# Patient Record
Sex: Female | Born: 1955 | Race: White | Hispanic: No | Smoking: Never smoker
Health system: Southern US, Community
[De-identification: ages and names within clinical notes are randomized; demographics above are authoritative.]

## PROBLEM LIST (undated history)

## (undated) DIAGNOSIS — E079 Disorder of thyroid, unspecified: Secondary | ICD-10-CM

## (undated) DIAGNOSIS — E785 Hyperlipidemia, unspecified: Secondary | ICD-10-CM

## (undated) DIAGNOSIS — I10 Essential (primary) hypertension: Secondary | ICD-10-CM

## (undated) HISTORY — DX: Disorder of thyroid, unspecified: E07.9

## (undated) HISTORY — DX: Hyperlipidemia, unspecified: E78.5

## (undated) HISTORY — PX: COLONOSCOPY: SHX174

## (undated) HISTORY — DX: Essential (primary) hypertension: I10

## (undated) HISTORY — PX: FACIAL RECONSTRUCTION SURGERY: SHX631

---

## 1998-09-25 ENCOUNTER — Ambulatory Visit (HOSPITAL_COMMUNITY): Admission: RE | Admit: 1998-09-25 | Discharge: 1998-09-25 | Payer: Self-pay | Admitting: Family Medicine

## 1998-09-25 ENCOUNTER — Encounter: Payer: Self-pay | Admitting: Family Medicine

## 1998-10-09 ENCOUNTER — Ambulatory Visit (HOSPITAL_BASED_OUTPATIENT_CLINIC_OR_DEPARTMENT_OTHER): Admission: RE | Admit: 1998-10-09 | Discharge: 1998-10-09 | Payer: Self-pay | Admitting: General Surgery

## 1998-10-09 ENCOUNTER — Encounter: Payer: Self-pay | Admitting: General Surgery

## 2000-07-10 ENCOUNTER — Ambulatory Visit (HOSPITAL_COMMUNITY): Admission: RE | Admit: 2000-07-10 | Discharge: 2000-07-10 | Payer: Self-pay | Admitting: Family Medicine

## 2000-07-10 ENCOUNTER — Encounter: Payer: Self-pay | Admitting: Family Medicine

## 2007-09-06 HISTORY — PX: BREAST SURGERY: SHX581

## 2012-12-11 ENCOUNTER — Other Ambulatory Visit (INDEPENDENT_AMBULATORY_CARE_PROVIDER_SITE_OTHER): Payer: BC Managed Care – PPO

## 2012-12-11 DIAGNOSIS — E039 Hypothyroidism, unspecified: Secondary | ICD-10-CM

## 2012-12-11 LAB — THYROID PANEL WITH TSH
Free Thyroxine Index: 3 (ref 1.0–3.9)
T3 Uptake: 33.3 % (ref 22.5–37.0)
T4, Total: 9.1 ug/dL (ref 5.0–12.5)
TSH: 6.208 u[IU]/mL — ABNORMAL HIGH (ref 0.350–4.500)

## 2012-12-11 NOTE — Progress Notes (Signed)
Patient came in for labs only.

## 2012-12-13 ENCOUNTER — Other Ambulatory Visit: Payer: Self-pay | Admitting: Nurse Practitioner

## 2012-12-13 MED ORDER — LEVOTHYROXINE SODIUM 100 MCG IV SOLR: 100.0000 ug | Freq: Every day | INTRAVENOUS | Status: DC

## 2013-01-17 ENCOUNTER — Encounter: Payer: Self-pay | Admitting: Gastroenterology

## 2013-02-06 ENCOUNTER — Telehealth: Payer: Self-pay | Admitting: Family Medicine

## 2013-02-06 NOTE — Telephone Encounter (Signed)
Appt made for tomorow

## 2013-02-07 ENCOUNTER — Ambulatory Visit (INDEPENDENT_AMBULATORY_CARE_PROVIDER_SITE_OTHER): Payer: BC Managed Care – PPO | Admitting: Family Medicine

## 2013-02-07 ENCOUNTER — Encounter: Payer: Self-pay | Admitting: Family Medicine

## 2013-02-07 VITALS — BP 149/94 | HR 64 | Temp 98.8°F | Wt 198.4 lb

## 2013-02-07 DIAGNOSIS — J029 Acute pharyngitis, unspecified: Secondary | ICD-10-CM

## 2013-02-07 DIAGNOSIS — R6889 Other general symptoms and signs: Secondary | ICD-10-CM

## 2013-02-07 LAB — POCT RAPID STREP A (OFFICE): Rapid Strep A Screen: NEGATIVE

## 2013-02-07 LAB — POCT INFLUENZA A/B
Influenza A, POC: NEGATIVE
Influenza B, POC: NEGATIVE

## 2013-02-07 NOTE — Progress Notes (Signed)
Patient ID: Madeline Clark, female   DOB: 1956-08-03, 57 y.o.   MRN: [id number] SUBJECTIVE: Chief Complaint  Patient presents with  . Acute Visit    flu -like symptoms  and sore throat   HPI: sorethroat and fevers 3 days. Better today. Came to get checked. No SOB. No exposure to strep.  PMH/PSH: reviewed/updated in Epic  SH/FH: reviewed/updated in Epic  Allergies: reviewed/updated in Epic  Medications: reviewed/updated in Epic  Immunizations: reviewed/updated in Epic  ROS: As above in the HPI. All other systems are stable or negative.  OBJECTIVE: APPEARANCE:  Patient in no acute distress.The patient appeared well nourished and normally developed. Acyanotic. Waist: VITAL SIGNS:BP 149/94  Pulse 64  Temp(Src) 98.8 F (37.1 C) (Oral)  Wt 198 lb 6.4 oz (89.994 kg) WF  SKIN: warm and  Dry without overt rashes, tattoos and scars  HEAD and Neck: without JVD, Head and scalp: normal Eyes:No scleral icterus. Fundi normal, eye movements normal. Ears: Auricle normal, canal normal, Tympanic membranes normal, insufflation normal. Nose:congested. Throat:red , no exudates Neck & thyroid: normal  CHEST & LUNGS: Chest wall: normal Lungs: Clear  CVS: Reveals the PMI to be normally located. Regular rhythm, First and Second Heart sounds are normal,  absence of murmurs, rubs or gallops. Peripheral vasculature: Radial pulses: normal Dorsal pedis pulses: normal Posterior pulses: normal  ABDOMEN:  Appearance: normal Benign, no organomegaly, no masses, no Abdominal Aortic enlargement. No Guarding , no rebound. No Bruits. Bowel sounds: normal  RECTAL: N/A GU: N/A  EXTREMETIES: nonedematous. Both Femoral and Pedal pulses are normal.   ASSESSMENT: Flu-like symptoms - Plan: POCT Influenza A/B  Sore throat - Plan: POCT rapid strep A  PLAN: cepacol samples given. Lozenges. Saline gargles Orders Placed This Encounter  Procedures  . POCT Influenza A/B  . POCT rapid  strep A   No orders of the defined types were placed in this encounter.   Results for orders placed in visit on 02/07/13  POCT INFLUENZA A/B      Result Value Range   Influenza A, POC Negative     Influenza B, POC Negative    POCT RAPID STREP A (OFFICE)      Result Value Range   Rapid Strep A Screen Negative  Negative   Note for work.  Return if symptoms worsen or fail to improve.  Sebastain Fishbaugh P. [place]esto Charon, M.D.

## 2013-03-20 ENCOUNTER — Other Ambulatory Visit: Payer: Self-pay

## 2013-03-20 MED ORDER — LEVOTHYROXINE SODIUM 100 MCG IV SOLR: 100.0000 ug | Freq: Every day | INTRAVENOUS | Status: DC

## 2013-04-10 ENCOUNTER — Other Ambulatory Visit (INDEPENDENT_AMBULATORY_CARE_PROVIDER_SITE_OTHER): Payer: BC Managed Care – PPO

## 2013-04-10 DIAGNOSIS — E039 Hypothyroidism, unspecified: Secondary | ICD-10-CM

## 2013-04-10 NOTE — Progress Notes (Signed)
Patient came in for re-check on thyroid

## 2013-04-11 LAB — THYROID PANEL WITH TSH
Free Thyroxine Index: 2.2 (ref 1.2–4.9)
T3 Uptake Ratio: 29 % (ref 24–39)
T4, Total: 7.6 ug/dL (ref 4.5–12.0)
TSH: 1.07 u[IU]/mL (ref 0.450–4.500)

## 2013-04-18 ENCOUNTER — Ambulatory Visit (INDEPENDENT_AMBULATORY_CARE_PROVIDER_SITE_OTHER): Payer: BC Managed Care – PPO | Admitting: Nurse Practitioner

## 2013-04-18 ENCOUNTER — Encounter: Payer: Self-pay | Admitting: Nurse Practitioner

## 2013-04-18 VITALS — BP 163/91 | HR 66 | Temp 98.2°F | Ht 62.75 in | Wt 202.0 lb

## 2013-04-18 DIAGNOSIS — Z01419 Encounter for gynecological examination (general) (routine) without abnormal findings: Secondary | ICD-10-CM

## 2013-04-18 DIAGNOSIS — I10 Essential (primary) hypertension: Secondary | ICD-10-CM

## 2013-04-18 DIAGNOSIS — Z124 Encounter for screening for malignant neoplasm of cervix: Secondary | ICD-10-CM

## 2013-04-18 DIAGNOSIS — E039 Hypothyroidism, unspecified: Secondary | ICD-10-CM | POA: Insufficient documentation

## 2013-04-18 DIAGNOSIS — Z0000 Encounter for general adult medical examination without abnormal findings: Secondary | ICD-10-CM

## 2013-04-18 LAB — POCT URINALYSIS DIPSTICK
Ketones, UA: NEGATIVE
Protein, UA: NEGATIVE
Spec Grav, UA: 1.025
pH, UA: 6

## 2013-04-18 LAB — POCT UA - MICROSCOPIC ONLY: Crystals, Ur, HPF, POC: NEGATIVE

## 2013-04-18 LAB — POCT CBC
Granulocyte percent: 63.1 %G (ref 37–80)
Lymph, poc: 2.2 (ref 0.6–3.4)
MCH, POC: 28.1 pg (ref 27–31.2)
MPV: 8.9 fL (ref 0–99.8)
POC Granulocyte: 4.3 (ref 2–6.9)
POC LYMPH PERCENT: 31.8 %L (ref 10–50)
Platelet Count, POC: 249 10*3/uL (ref 142–424)
RBC: 5.1 M/uL (ref 4.04–5.48)

## 2013-04-18 MED ORDER — LEVOTHYROXINE SODIUM 100 MCG PO TABS: 100.0000 ug | ORAL_TABLET | Freq: Every day | ORAL | Status: DC

## 2013-04-18 MED ORDER — HYDROCHLOROTHIAZIDE 25 MG PO TABS: 25.0000 mg | ORAL_TABLET | Freq: Every day | ORAL | Status: DC

## 2013-04-18 NOTE — Patient Instructions (Signed)

## 2013-04-18 NOTE — Progress Notes (Signed)
Subjective:    Patient ID: Madeline Clark, female    DOB: 04-18-1956, 57 y.o.   MRN: [id number]  HPI  Patient in today for a CPE and PAP- she is doing well. Only medical problem is hypothyroidism in which she takes levothyroxin for- SHe has no complaints today.  History of hypertension use to be on HCTZ- ran out and never called back for refill.  Also suppose to be on crestor for cholesterol,patient said it made her feel weird so she stopped taking.   Review of Systems  Constitutional: Negative.   HENT: Negative.   Eyes: Negative.   Respiratory: Negative.   Cardiovascular: Negative.   Gastrointestinal: Negative.   Endocrine: Negative.   Genitourinary: Negative.   Musculoskeletal: Negative.   Allergic/Immunologic: Negative.   Neurological: Negative.   Hematological: Negative.   Psychiatric/Behavioral: Negative.        Objective:   Physical Exam  Constitutional: She is oriented to person, place, and time. She appears well-developed and well-nourished.  HENT:  Head: Normocephalic.  Right Ear: Hearing, tympanic membrane, external ear and ear canal normal.  Left Ear: Hearing, tympanic membrane, external ear and ear canal normal.  Nose: Nose normal.  Mouth/Throat: Uvula is midline and oropharynx is clear and moist.  Eyes: Conjunctivae and EOM are normal. Pupils are equal, round, and reactive to light.  Neck: Normal range of motion and full passive range of motion without pain. Neck supple. No JVD present. Carotid bruit is not present. No mass and no thyromegaly present.  Cardiovascular: Normal rate, normal heart sounds and intact distal pulses.   No murmur heard. Pulmonary/Chest: Effort normal and breath sounds normal. Right breast exhibits no inverted nipple, no mass, no nipple discharge, no skin change and no tenderness. Left breast exhibits no inverted nipple, no mass, no nipple discharge, no skin change and no tenderness.  Abdominal: Soft. Bowel sounds are normal. She exhibits  no mass. There is no tenderness.  Genitourinary: Vagina normal and uterus normal. No breast swelling, tenderness, discharge or bleeding.  bimanual exam-No adnexal masses or tenderness.  Cervix parous and pink- no discharge  Musculoskeletal: Normal range of motion.  Lymphadenopathy:    She has no cervical adenopathy.  Neurological: She is alert and oriented to person, place, and time.  Skin: Skin is warm and dry.  Psychiatric: She has a normal mood and affect. Her behavior is normal. Judgment and thought content normal.   BP 163/91  Pulse 66  Temp(Src) 98.2 F (36.8 C) (Oral)  Ht 5' 2.75" (1.594 m)  Wt 202 lb (91.627 kg)  BMI 36.06 kg/m2        Assessment & Plan:   1. Annual physical exam   2. Hypothyroidism   3. Encounter for routine gynecological examination   4. Hypertension    Orders Placed This Encounter  Procedures  . Urine culture  . CMP14+EGFR  . NMR, lipoprofile  . POCT UA - Microscopic Only  . POCT urinalysis dipstick  . POCT CBC   Meds ordered this encounter  Medications  . DISCONTD: levothyroxine (SYNTHROID, LEVOTHROID) 100 MCG tablet    Sig: Take 100 mcg by mouth daily before breakfast.  . levothyroxine (SYNTHROID, LEVOTHROID) 100 MCG tablet    Sig: Take 1 tablet (100 mcg total) by mouth daily before breakfast.    Dispense:  90 tablet    Refill:  1   Low fat diet and exercise Low NA+ in diet Labs pending- May need to go on statin if cholesterol looks bad  *  Patient needs diagnostic mammogram on right as repeat but refused back in February due to expense- Still refusing- Going to call insurance and find out why it cost her so much and she will let me know what she wants to do.  Mary-Margaret Daphine Deutscher, FN

## 2013-04-20 LAB — NMR, LIPOPROFILE
HDL Cholesterol by NMR: 55 mg/dL (ref >=40)
HDL Particle Number: 30.9 umol/L (ref >=30.5)
LDLC SERPL CALC-MCNC: 109 mg/dL — ABNORMAL HIGH (ref <100)

## 2013-04-20 LAB — CMP14+EGFR
AST: 17 IU/L (ref 0–40)
BUN: 10 mg/dL (ref 6–24)
CO2: 26 mmol/L (ref 18–29)
Calcium: 9.7 mg/dL (ref 8.7–10.2)
Creatinine, Ser: 0.93 mg/dL (ref 0.57–1.00)
Globulin, Total: 2.2 g/dL (ref 1.5–4.5)
Glucose: 99 mg/dL (ref 65–99)
Sodium: 144 mmol/L (ref 134–144)

## 2013-04-23 LAB — PAP IG W/ RFLX HPV ASCU: PAP Smear Comment: 0

## 2013-04-29 ENCOUNTER — Encounter: Payer: Self-pay | Admitting: *Deleted

## 2013-05-17 ENCOUNTER — Encounter: Payer: BC Managed Care – PPO | Admitting: Nurse Practitioner

## 2013-08-20 ENCOUNTER — Telehealth: Payer: Self-pay | Admitting: Nurse Practitioner

## 2013-08-20 MED ORDER — LEVOTHYROXINE SODIUM 100 MCG PO TABS: 100.0000 ug | ORAL_TABLET | Freq: Every day | ORAL | Status: DC

## 2013-08-20 NOTE — Telephone Encounter (Signed)
rx sent to pharmacy

## 2013-08-21 NOTE — Telephone Encounter (Signed)
Message left that rx was sent to pharmacy

## 2013-11-20 ENCOUNTER — Other Ambulatory Visit: Payer: Self-pay | Admitting: Nurse Practitioner

## 2013-11-20 ENCOUNTER — Ambulatory Visit (INDEPENDENT_AMBULATORY_CARE_PROVIDER_SITE_OTHER): Payer: BC Managed Care – PPO | Admitting: Nurse Practitioner

## 2013-11-20 ENCOUNTER — Encounter: Payer: Self-pay | Admitting: Nurse Practitioner

## 2013-11-20 VITALS — BP 144/81 | HR 52 | Temp 97.8°F | Ht 62.75 in | Wt 205.6 lb

## 2013-11-20 DIAGNOSIS — E039 Hypothyroidism, unspecified: Secondary | ICD-10-CM

## 2013-11-20 DIAGNOSIS — E785 Hyperlipidemia, unspecified: Secondary | ICD-10-CM

## 2013-11-20 DIAGNOSIS — I10 Essential (primary) hypertension: Secondary | ICD-10-CM | POA: Insufficient documentation

## 2013-11-20 MED ORDER — LEVOTHYROXINE SODIUM 100 MCG PO TABS: 100.0000 ug | ORAL_TABLET | Freq: Every day | ORAL | Status: DC

## 2013-11-20 MED ORDER — SIMVASTATIN 40 MG PO TABS: 40.0000 mg | ORAL_TABLET | Freq: Every day | ORAL | Status: DC

## 2013-11-20 NOTE — Progress Notes (Signed)
   Subjective:    Patient ID: Madeline Clark, female    DOB: [birth year], 58 y.o.   MRN: [id number]  Patient presents today for followup of chronic illness. Patient is complaining of dry, red eyes for several months. Patient has to use eyedrops daily for lubrication. Has been told in the past that she does not produce enough tears.   Hypertension This is a chronic problem. The current episode started more than 1 year ago. The problem has been gradually improving since onset. Associated symptoms include blurred vision. Pertinent negatives include no chest pain, headaches, palpitations or peripheral edema. Risk factors for coronary artery disease include sedentary lifestyle. Past treatments include diuretics. Compliance problems include exercise and diet.  Hypertensive end-organ damage includes a thyroid problem.   Hypothyroidism Synthyroid 100 mcg daily - denies fatigue, heat/cold intolerance, or weight gain/loss    Review of Systems  Constitutional: Negative for fatigue and unexpected weight change.  Eyes: Positive for blurred vision.  Cardiovascular: Negative for chest pain and palpitations.  Gastrointestinal: Negative for diarrhea and constipation.  Neurological: Negative for dizziness and headaches.  Psychiatric/Behavioral: Negative for sleep disturbance.       Objective:   Physical Exam  Constitutional: She is oriented to person, place, and time. She appears well-developed and well-nourished.  HENT:  Head: Normocephalic.  Right Ear: External ear normal.  Left Ear: External ear normal.  Mouth/Throat: Oropharynx is clear and moist.  Eyes: Pupils are equal, round, and reactive to light.  Neck: Normal range of motion. Neck supple. No thyromegaly present.  Cardiovascular: Normal rate, regular rhythm and normal heart sounds.   Pulmonary/Chest: Effort normal and breath sounds normal.  Abdominal: Soft. Bowel sounds are normal.  Neurological: She is alert and oriented to person,  place, and time.  Skin: Skin is warm and dry.  Psychiatric: She has a normal mood and affect. Her behavior is normal. Judgment and thought content normal.    BP 144/81  Pulse 52  Temp(Src) 97.8 F (36.6 C) (Oral)  Ht 5' 2.75" (1.594 m)  Wt 205 lb 9.6 oz (93.26 kg)  BMI 36.70 kg/m2       Assessment & Plan:   1. Essential hypertension, benign   2. Hypothyroidism    Meds ordered this encounter  Medications  . levothyroxine (SYNTHROID, LEVOTHROID) 100 MCG tablet    Sig: Take 1 tablet (100 mcg total) by mouth daily before breakfast.    Dispense:  90 tablet    Refill:  1    Order Specific Question:  Supervising Provider    Answer:  Chipper Herb [[id number]]  . simvastatin (ZOCOR) 40 MG tablet    Sig: Take 1 tablet (40 mg total) by mouth at bedtime.    Dispense:  90 tablet    Refill:  1    Order Specific Question:  Supervising Provider    Answer:  Joycelyn Man   Patietn will check on cost of mammogram Want sto wait and schedule colonoscopy in summer Labs done at work - revuewd with patient- LDL elevated,  Patient to make opthalmology appt Health maintenance reviewed Diet and exercise encouraged Continue all meds Follow up  In 6 months   Abrams, FNP

## 2013-11-20 NOTE — Patient Instructions (Signed)
Health Maintenance, Female A healthy lifestyle and preventative care can promote health and wellness.  Maintain regular health, dental, and eye exams.  Eat a healthy diet. Foods like vegetables, fruits, whole grains, low-fat dairy products, and lean protein foods contain the nutrients you need without too many calories. Decrease your intake of foods high in solid fats, added sugars, and salt. Get information about a proper diet from your caregiver, if necessary.  Regular physical exercise is one of the most important things you can do for your health. Most adults should get at least 150 minutes of moderate-intensity exercise (any activity that increases your heart rate and causes you to sweat) each week. In addition, most adults need muscle-strengthening exercises on 2 or more days a week.   Maintain a healthy weight. The body mass index (BMI) is a screening tool to identify possible weight problems. It provides an estimate of body fat based on height and weight. Your caregiver can help determine your BMI, and can help you achieve or maintain a healthy weight. For adults 20 years and older:  A BMI below 18.5 is considered underweight.  A BMI of 18.5 to 24.9 is normal.  A BMI of 25 to 29.9 is considered overweight.  A BMI of 30 and above is considered obese.  Maintain normal blood lipids and cholesterol by exercising and minimizing your intake of saturated fat. Eat a balanced diet with plenty of fruits and vegetables. Blood tests for lipids and cholesterol should begin at age 68 and be repeated every 5 years. If your lipid or cholesterol levels are high, you are over 50, or you are a high risk for heart disease, you may need your cholesterol levels checked more frequently.Ongoing high lipid and cholesterol levels should be treated with medicines if diet and exercise are not effective.  If you smoke, find out from your caregiver how to quit. If you do not use tobacco, do not start.  Lung  cancer screening is recommended for adults aged 58 80 years who are at high risk for developing lung cancer because of a history of smoking. Yearly low-dose computed tomography (CT) is recommended for people who have at least a 30-pack-year history of smoking and are a current smoker or have quit within the past 15 years. A pack year of smoking is smoking an average of 1 pack of cigarettes a day for 1 year (for example: 1 pack a day for 30 years or 2 packs a day for 15 years). Yearly screening should continue until the smoker has stopped smoking for at least 15 years. Yearly screening should also be stopped for people who develop a health problem that would prevent them from having lung cancer treatment.  If you are pregnant, do not drink alcohol. If you are breastfeeding, be very cautious about drinking alcohol. If you are not pregnant and choose to drink alcohol, do not exceed 1 drink per day. One drink is considered to be 12 ounces (355 mL) of beer, 5 ounces (148 mL) of wine, or 1.5 ounces (44 mL) of liquor.  Avoid use of street drugs. Do not share needles with anyone. Ask for help if you need support or instructions about stopping the use of drugs.  High blood pressure causes heart disease and increases the risk of stroke. Blood pressure should be checked at least every 1 to 2 years. Ongoing high blood pressure should be treated with medicines, if weight loss and exercise are not effective.  If you are 55 to  56 years old, ask your caregiver if you should take aspirin to prevent strokes.  Diabetes screening involves taking a blood sample to check your fasting blood sugar level. This should be done once every 3 years, after age 40, if you are within normal weight and without risk factors for diabetes. Testing should be considered at a younger age or be carried out more frequently if you are overweight and have at least 1 risk factor for diabetes.  Breast cancer screening is essential preventative care  for women. You should practice "breast self-awareness." This means understanding the normal appearance and feel of your breasts and may include breast self-examination. Any changes detected, no matter how small, should be reported to a caregiver. Women in their 18s and 30s should have a clinical breast exam (CBE) by a caregiver as part of a regular health exam every 1 to 3 years. After age 39, women should have a CBE every year. Starting at age 53, women should consider having a mammogram (breast X-ray) every year. Women who have a family history of breast cancer should talk to their caregiver about genetic screening. Women at a high risk of breast cancer should talk to their caregiver about having an MRI and a mammogram every year.  Breast cancer gene (BRCA)-related cancer risk assessment is recommended for women who have family members with BRCA-related cancers. BRCA-related cancers include breast, ovarian, tubal, and peritoneal cancers. Having family members with these cancers may be associated with an increased risk for harmful changes (mutations) in the breast cancer genes BRCA1 and BRCA2. Results of the assessment will determine the need for genetic counseling and BRCA1 and BRCA2 testing.  The Pap test is a screening test for cervical cancer. Women should have a Pap test starting at age 69. Between ages 41 and 71, Pap tests should be repeated every 2 years. Beginning at age 66, you should have a Pap test every 3 years as long as the past 3 Pap tests have been normal. If you had a hysterectomy for a problem that was not cancer or a condition that could lead to cancer, then you no longer need Pap tests. If you are between ages 25 and 82, and you have had normal Pap tests going back 10 years, you no longer need Pap tests. If you have had past treatment for cervical cancer or a condition that could lead to cancer, you need Pap tests and screening for cancer for at least 20 years after your treatment. If Pap  tests have been discontinued, risk factors (such as a new sexual partner) need to be reassessed to determine if screening should be resumed. Some women have medical problems that increase the chance of getting cervical cancer. In these cases, your caregiver may recommend more frequent screening and Pap tests.  The human papillomavirus (HPV) test is an additional test that may be used for cervical cancer screening. The HPV test looks for the virus that can cause the cell changes on the cervix. The cells collected during the Pap test can be tested for HPV. The HPV test could be used to screen women aged 1 years and older, and should be used in women of any age who have unclear Pap test results. After the age of 36, women should have HPV testing at the same frequency as a Pap test.  Colorectal cancer can be detected and often prevented. Most routine colorectal cancer screening begins at the age of 58 and continues through age 71. However, your caregiver  may recommend screening at an earlier age if you have risk factors for colon cancer. On a yearly basis, your caregiver may provide home test kits to check for hidden blood in the stool. Use of a small camera at the end of a tube, to directly examine the colon (sigmoidoscopy or colonoscopy), can detect the earliest forms of colorectal cancer. Talk to your caregiver about this at age 73, when routine screening begins. Direct examination of the colon should be repeated every 5 to 10 years through age 61, unless early forms of pre-cancerous polyps or small growths are found.  Hepatitis C blood testing is recommended for all people born from [birth year] through 1963 and any individual with known risks for hepatitis C.  Practice safe sex. Use condoms and avoid high-risk sexual practices to reduce the spread of sexually transmitted infections (STIs). Sexually active women aged 38 and younger should be checked for Chlamydia, which is a common sexually transmitted infection.  Older women with new or multiple partners should also be tested for Chlamydia. Testing for other STIs is recommended if you are sexually active and at increased risk.  Osteoporosis is a disease in which the bones lose minerals and strength with aging. This can result in serious bone fractures. The risk of osteoporosis can be identified using a bone density scan. Women ages 44 and over and women at risk for fractures or osteoporosis should discuss screening with their caregivers. Ask your caregiver whether you should be taking a calcium supplement or vitamin D to reduce the rate of osteoporosis.  Menopause can be associated with physical symptoms and risks. Hormone replacement therapy is available to decrease symptoms and risks. You should talk to your caregiver about whether hormone replacement therapy is right for you.  Use sunscreen. Apply sunscreen liberally and repeatedly throughout the day. You should seek shade when your shadow is shorter than you. Protect yourself by wearing long sleeves, pants, a wide-brimmed hat, and sunglasses year round, whenever you are outdoors.  Notify your caregiver of new moles or changes in moles, especially if there is a change in shape or color. Also notify your caregiver if a mole is larger than the size of a pencil eraser.  Stay current with your immunizations. Document Released: 03/07/2011 Document Revised: 12/17/2012 Document Reviewed: 03/07/2011 [facility] Patient Information 2014 [place].

## 2014-03-04 ENCOUNTER — Ambulatory Visit (INDEPENDENT_AMBULATORY_CARE_PROVIDER_SITE_OTHER): Payer: BC Managed Care – PPO | Admitting: Nurse Practitioner

## 2014-03-04 ENCOUNTER — Encounter: Payer: Self-pay | Admitting: Nurse Practitioner

## 2014-03-04 VITALS — BP 126/64 | HR 62 | Temp 98.1°F | Ht 62.0 in | Wt 205.0 lb

## 2014-03-04 DIAGNOSIS — Z1211 Encounter for screening for malignant neoplasm of colon: Secondary | ICD-10-CM

## 2014-03-04 DIAGNOSIS — E785 Hyperlipidemia, unspecified: Secondary | ICD-10-CM

## 2014-03-04 DIAGNOSIS — E038 Other specified hypothyroidism: Secondary | ICD-10-CM

## 2014-03-04 DIAGNOSIS — I10 Essential (primary) hypertension: Secondary | ICD-10-CM

## 2014-03-04 MED ORDER — SIMVASTATIN 40 MG PO TABS: 40.0000 mg | ORAL_TABLET | Freq: Every day | ORAL | Status: DC

## 2014-03-04 MED ORDER — HYDROCHLOROTHIAZIDE 25 MG PO TABS: ORAL_TABLET | ORAL | Status: DC

## 2014-03-04 MED ORDER — LEVOTHYROXINE SODIUM 100 MCG PO TABS: 100.0000 ug | ORAL_TABLET | Freq: Every day | ORAL | Status: DC

## 2014-03-04 NOTE — Patient Instructions (Signed)
Hypertension Hypertension, commonly called high blood pressure, is when the force of blood pumping through your arteries is too strong. Your arteries are the blood vessels that carry blood from your heart throughout your body. A blood pressure reading consists of a higher number over a lower number, such as 110/72. The higher number (systolic) is the pressure inside your arteries when your heart pumps. The lower number (diastolic) is the pressure inside your arteries when your heart relaxes. Ideally you want your blood pressure below 120/80. Hypertension forces your heart to work harder to pump blood. Your arteries may become narrow or stiff. Having hypertension puts you at risk for heart disease, stroke, and other problems.  RISK FACTORS Some risk factors for high blood pressure are controllable. Others are not.  Risk factors you cannot control include:   Race. You may be at higher risk if you are African American.  Age. Risk increases with age.  Gender. Men are at higher risk than women before age 45 years. After age 65, women are at higher risk than men. Risk factors you can control include:  Not getting enough exercise or physical activity.  Being overweight.  Getting too much fat, sugar, calories, or salt in your diet.  Drinking too much alcohol. SIGNS AND SYMPTOMS Hypertension does not usually cause signs or symptoms. Extremely high blood pressure (hypertensive crisis) may cause headache, anxiety, shortness of breath, and nosebleed. DIAGNOSIS  To check if you have hypertension, your health care provider will measure your blood pressure while you are seated, with your arm held at the level of your heart. It should be measured at least twice using the same arm. Certain conditions can cause a difference in blood pressure between your right and left arms. A blood pressure reading that is higher than normal on one occasion does not mean that you need treatment. If one blood pressure reading  is high, ask your health care provider about having it checked again. TREATMENT  Treating high blood pressure includes making lifestyle changes and possibly taking medication. Living a healthy lifestyle can help lower high blood pressure. You may need to change some of your habits. Lifestyle changes may include:  Following the DASH diet. This diet is high in fruits, vegetables, and whole grains. It is low in salt, red meat, and added sugars.  Getting at least 2 1/2 hours of brisk physical activity every week.  Losing weight if necessary.  Not smoking.  Limiting alcoholic beverages.  Learning ways to reduce stress. If lifestyle changes are not enough to get your blood pressure under control, your health care provider may prescribe medicine. You may need to take more than one. Work closely with your health care provider to understand the risks and benefits. HOME CARE INSTRUCTIONS  Have your blood pressure rechecked as directed by your health care provider.   Only take medicine as directed by your health care provider. Follow the directions carefully. Blood pressure medicines must be taken as prescribed. The medicine does not work as well when you skip doses. Skipping doses also puts you at risk for problems.   Do not smoke.   Monitor your blood pressure at home as directed by your health care provider. SEEK MEDICAL CARE IF:   You think you are having a reaction to medicines taken.  You have recurrent headaches or feel dizzy.  You have swelling in your ankles.  You have trouble with your vision. SEEK IMMEDIATE MEDICAL CARE IF:  You develop a severe headache or   confusion.  You have unusual weakness, numbness, or feel faint.  You have severe chest or abdominal pain.  You vomit repeatedly.  You have trouble breathing. MAKE SURE YOU:   Understand these instructions.  Will watch your condition.  Will get help right away if you are not doing well or get  worse. Document Released: 08/22/2005 Document Revised: 08/27/2013 Document Reviewed: 06/14/2013 [employer] Patient Information 2015 [employer]. This information is not intended to replace advice given to you by your health care provider. Make sure you discuss any questions you have with your health care provider.  

## 2014-03-04 NOTE — Progress Notes (Signed)
   Subjective:    Patient ID: Madeline Clark, female    DOB: [birth year], 58 y.o.   MRN: [id number]  Patient presents today for followup of chronic illness. Patient complaints of dry and red eyes, she does not see an optometrist, she is using over the counter reading glasses.     Hypertension This is a chronic problem. The current episode started more than 1 year ago. The problem has been gradually improving since onset. Pertinent negatives include no chest pain, headaches, palpitations or peripheral edema. Risk factors for coronary artery disease include sedentary lifestyle. Past treatments include diuretics. Compliance problems include exercise and diet.  Hypertensive end-organ damage includes a thyroid problem.  Hyperlipidemia This is a chronic problem. The current episode started more than 1 year ago. The problem is controlled. Exacerbating diseases include hypothyroidism. Pertinent negatives include no chest pain, leg pain or myalgias. She is currently on no antihyperlipidemic treatment. The current treatment provides moderate improvement of lipids. There are no compliance problems.  Risk factors for coronary artery disease include hypertension, obesity and post-menopausal.   Hypothyroidism Synthyroid 100 mcg daily - denies fatigue, heat/cold intolerance, or weight gain/loss    Review of Systems  Constitutional: Negative for fatigue and unexpected weight change.  HENT: Negative.   Cardiovascular: Negative for chest pain and palpitations.  Gastrointestinal: Negative for diarrhea and constipation.  Musculoskeletal: Negative for myalgias.  Neurological: Negative for dizziness and headaches.  Psychiatric/Behavioral: Negative for sleep disturbance.       Objective:   Physical Exam  Constitutional: She is oriented to person, place, and time. She appears well-developed and well-nourished.  HENT:  Head: Normocephalic.  Right Ear: External ear normal.  Left Ear: External ear normal.    Mouth/Throat: Oropharynx is clear and moist.  Eyes: Pupils are equal, round, and reactive to light.  Neck: Normal range of motion. Neck supple. No thyromegaly present.  Cardiovascular: Normal rate, regular rhythm and normal heart sounds.   Pulmonary/Chest: Effort normal and breath sounds normal.  Abdominal: Soft. Bowel sounds are normal.  Neurological: She is alert and oriented to person, place, and time.  Skin: Skin is warm and dry.  Psychiatric: She has a normal mood and affect. Her behavior is normal. Judgment and thought content normal.   BP 126/64  Pulse 62  Temp(Src) 98.1 F (36.7 C) (Oral)  Ht $R'5\' 2"'zk$  (1.575 m)  Wt 205 lb (92.987 kg)  BMI 37.49 kg/m2  .this     Assessment & Plan:   1. Essential hypertension, benign   2. Hyperlipidemia with target LDL less than 100   3. Other specified hypothyroidism    Orders Placed This Encounter  Procedures  . CMP14+EGFR  . NMR, lipoprofile  . TSH   Referral for colonoscopy Patient needs to get records from old mammogram- she will call when she has done that to schedule mammo in [employer] pending Health maintenance reviewed Diet and exercise encouraged Continue all meds Follow up  In 3 months prn   Mary-Margaret Hassell Done, FNP

## 2014-03-05 LAB — NMR, LIPOPROFILE
Cholesterol: 122 mg/dL (ref 100–199)
HDL Cholesterol by NMR: 50 mg/dL (ref >39)
HDL Particle Number: 34.8 umol/L (ref >=30.5)
LDL PARTICLE NUMBER: 598 nmol/L (ref <1000)
LDL Size: 20.6 nm (ref >20.5)
LDLC SERPL CALC-MCNC: 53 mg/dL (ref 0–99)
LP-IR SCORE: 42 (ref <=45)
Small LDL Particle Number: 305 nmol/L (ref <=527)
Triglycerides by NMR: 97 mg/dL (ref 0–149)

## 2014-03-05 LAB — CMP14+EGFR
ALK PHOS: 69 IU/L (ref 39–117)
ALT: 16 IU/L (ref 0–32)
AST: 17 IU/L (ref 0–40)
Albumin/Globulin Ratio: 2 (ref 1.1–2.5)
Albumin: 4.2 g/dL (ref 3.5–5.5)
BUN / CREAT RATIO: 11 (ref 9–23)
BUN: 11 mg/dL (ref 6–24)
CO2: 26 mmol/L (ref 18–29)
Calcium: 9.5 mg/dL (ref 8.7–10.2)
Chloride: 106 mmol/L (ref 97–108)
Creatinine, Ser: 1.01 mg/dL — ABNORMAL HIGH (ref 0.57–1.00)
GFR calc Af Amer: 72 mL/min/{1.73_m2} (ref >59)
GFR calc non Af Amer: 63 mL/min/{1.73_m2} (ref >59)
Globulin, Total: 2.1 g/dL (ref 1.5–4.5)
Glucose: 101 mg/dL — ABNORMAL HIGH (ref 65–99)
Potassium: 4.7 mmol/L (ref 3.5–5.2)
SODIUM: 146 mmol/L — AB (ref 134–144)
Total Bilirubin: 0.5 mg/dL (ref 0.0–1.2)
Total Protein: 6.3 g/dL (ref 6.0–8.5)

## 2014-03-05 LAB — TSH: TSH: 0.827 u[IU]/mL (ref 0.450–4.500)

## 2014-03-26 ENCOUNTER — Encounter: Payer: Self-pay | Admitting: Internal Medicine

## 2014-05-05 ENCOUNTER — Ambulatory Visit (AMBULATORY_SURGERY_CENTER): Payer: BC Managed Care – PPO

## 2014-05-05 VITALS — Ht 62.5 in | Wt 207.0 lb

## 2014-05-05 DIAGNOSIS — Z1211 Encounter for screening for malignant neoplasm of colon: Secondary | ICD-10-CM

## 2014-05-05 MED ORDER — MOVIPREP 100 G PO SOLR: 1.0000 | Freq: Once | ORAL | Status: DC

## 2014-05-05 NOTE — Progress Notes (Signed)
No allergies to eggs or soy No home oxygen No past problems with anesthesia No diet/weight loss meds  Has email  Emmi instructructions given for colonoscopy

## 2014-05-08 ENCOUNTER — Encounter: Payer: Self-pay | Admitting: Internal Medicine

## 2014-05-19 ENCOUNTER — Ambulatory Visit (AMBULATORY_SURGERY_CENTER): Payer: BC Managed Care – PPO | Admitting: Internal Medicine

## 2014-05-19 ENCOUNTER — Encounter: Payer: Self-pay | Admitting: Internal Medicine

## 2014-05-19 ENCOUNTER — Telehealth: Payer: Self-pay

## 2014-05-19 ENCOUNTER — Other Ambulatory Visit: Payer: Self-pay

## 2014-05-19 VITALS — BP 120/51 | HR 58 | Temp 96.9°F | Resp 15 | Ht 62.5 in | Wt 207.0 lb

## 2014-05-19 DIAGNOSIS — D126 Benign neoplasm of colon, unspecified: Secondary | ICD-10-CM

## 2014-05-19 DIAGNOSIS — Z1211 Encounter for screening for malignant neoplasm of colon: Secondary | ICD-10-CM

## 2014-05-19 DIAGNOSIS — K639 Disease of intestine, unspecified: Secondary | ICD-10-CM

## 2014-05-19 MED ORDER — SODIUM CHLORIDE 0.9 % IV SOLN: 500.0000 mL | INTRAVENOUS | Status: DC

## 2014-05-19 NOTE — Progress Notes (Signed)
Called to room to assist during endoscopic procedure.  Patient ID and intended procedure confirmed with present staff. Received instructions for my participation in the procedure from the performing physician.  

## 2014-05-19 NOTE — Patient Instructions (Addendum)
YOU HAD AN ENDOSCOPIC PROCEDURE TODAY AT THE [facility]: Refer to the procedure report that was given to you for any specific questions about what was found during the examination.  If the procedure report does not answer your questions, please call your gastroenterologist to clarify.  If you requested that your care partner not be given the details of your procedure findings, then the procedure report has been included in a sealed envelope for you to review at your convenience later.  YOU SHOULD EXPECT: Some feelings of bloating in the abdomen. Passage of more gas than usual.  Walking can help get rid of the air that was put into your GI tract during the procedure and reduce the bloating. If you had a lower endoscopy (such as a colonoscopy or flexible sigmoidoscopy) you may notice spotting of blood in your stool or on the toilet paper. If you underwent a bowel prep for your procedure, then you may not have a normal bowel movement for a few days.  DIET: Your first meal following the procedure should be a light meal and then it is ok to progress to your normal diet.  A half-sandwich or bowl of soup is an example of a good first meal.  Heavy or fried foods are harder to digest and may make you feel nauseous or bloated.  Likewise meals heavy in dairy and vegetables can cause extra gas to form and this can also increase the bloating.  Drink plenty of fluids but you should avoid alcoholic beverages for 24 hours.  ACTIVITY: Your care partner should take you home directly after the procedure.  You should plan to take it easy, moving slowly for the rest of the day.  You can resume normal activity the day after the procedure however you should NOT DRIVE or use heavy machinery for 24 hours (because of the sedation medicines used during the test).    SYMPTOMS TO REPORT IMMEDIATELY: A gastroenterologist can be reached at any hour.  During normal business hours, 8:30 AM to 5:00 PM Monday through Friday,  call [phone].  After hours and on weekends, please call the GI answering service at [phone] who will take a message and have the physician on call contact you.   Following lower endoscopy (colonoscopy or flexible sigmoidoscopy):  Excessive amounts of blood in the stool  Significant tenderness or worsening of abdominal pains  Swelling of the abdomen that is new, acute  Fever of 100F or higher  FOLLOW UP: If any biopsies were taken you will be contacted by phone or by letter within the next 1-3 weeks.  Call your gastroenterologist if you have not heard about the biopsies in 3 weeks.  Our staff will call the home number listed on your records the next business day following your procedure to check on you and address any questions or concerns that you may have at that time regarding the information given to you following your procedure. This is a courtesy call and so if there is no answer at the home number and we have not heard from you through the emergency physician on call, we will assume that you have returned to your regular daily activities without incident.  SIGNATURES/CONFIDENTIALITY: You and/or your care partner have signed paperwork which will be entered into your electronic medical record.  These signatures attest to the fact that that the information above on your After Visit Summary has been reviewed and is understood.  Full responsibility of the confidentiality of this   discharge information lies with you and/or your care-partner.  Await pathology  Please read over handout about polyps  Please follow directions for your CT scan  Continue your normal medications

## 2014-05-19 NOTE — Progress Notes (Signed)
Report to PACU, RN, vss, BBS= Clear.  

## 2014-05-19 NOTE — Telephone Encounter (Signed)
Pt scheduled for CT of abdomen and pelvis at [place] [facility] 05/29/14@3 :30pm. Pt to be NPO after 11:30am. Pt to drink bottle 1 of contrast at 1:30pm, bottle 2 at 2:30pm. Pt aware of appt.

## 2014-05-19 NOTE — Op Note (Signed)
[facility]  [address] [place], [address]   COLONOSCOPY PROCEDURE REPORT  PATIENT: Madeline Clark, Madeline Clark  MR#: [id number] BIRTHDATE: 1955/12/09 , 37  yrs. old GENDER: Female ENDOSCOPIST: Jerene Bears, MD REFERRED ZY:SAYT Rockne Coons, N.P. PROCEDURE DATE:  05/19/2014 PROCEDURE:   Colonoscopy with snare polypectomy First Screening Colonoscopy - Avg.  risk and is 50 yrs.  old or older Yes.  Prior Negative Screening - Now for repeat screening. N/A  History of Adenoma - Now for follow-up colonoscopy & has been > or = to 3 yrs.  N/A  Polyps Removed Today? Yes. ASA CLASS:   Class II INDICATIONS:average risk screening. MEDICATIONS: MAC sedation, administered by CRNA and propofol (Diprivan) 300mg  IV  DESCRIPTION OF PROCEDURE:   After the risks benefits and alternatives of the procedure were thoroughly explained, informed consent was obtained.  A digital rectal exam revealed no rectal mass.   The LB [id number]-[id number]  endoscope was introduced through the anus and advanced to the cecum, which was identified by both the appendix and ileocecal valve. No adverse events experienced. The quality of the prep was good, using MoviPrep  The instrument was then slowly withdrawn as the colon was fully examined.   COLON FINDINGS: Question of submucosal lesion versus extrinsic compression in the cecum (opposite wall from ICV). A flat polyp measuring 5 mm in size with a mucous cap was found in the proximal transverse colon.  A polypectomy was performed with a cold snare. The resection was complete and the polyp tissue was completely retrieved.  Retroflexed views revealed internal hemorrhoids. The time to cecum=1 minutes 58 seconds.  Withdrawal time=12 minutes 51 seconds.  The scope was withdrawn and the procedure completed. COMPLICATIONS: There were no complications.  ENDOSCOPIC IMPRESSION: 1.   Question of submucosal lesion in the cecum 2.   Flat polyp measuring 5 mm in size was found  in the proximal transverse colon; polypectomy was performed with a cold snare   RECOMMENDATIONS: 1.  Await pathology results 2.  If the polyp removed today is proven to be an adenomatous (pre-cancerous) polyp, you will need a repeat colonoscopy in 5 years.  Otherwise you should continue to follow colorectal cancer screening guidelines for "routine risk" patients with colonoscopy in 10 years.  You will receive a letter within 1-2 weeks with the results of your biopsy as well as final recommendations.  Please call my office if you have not received a letter after 3 weeks. 3.  CT abd/pelvis with contrast to exclude cecal lesion  eSigned:  Jerene Bears, MD 05/19/2014 2:13 PM      cc: The Patient and Chevis Pretty, NP

## 2014-05-20 ENCOUNTER — Telehealth: Payer: Self-pay | Admitting: *Deleted

## 2014-05-20 NOTE — Telephone Encounter (Signed)
  Follow up Call-  Call back number 05/19/2014  Post procedure Call Back phone  # [phone] cell  Permission to leave phone message Yes     Patient questions:  Do you have a fever, pain , or abdominal swelling? No. Pain Score  0 *  Have you tolerated food without any problems? Yes.    Have you been able to return to your normal activities? Yes.    Do you have any questions about your discharge instructions: Diet   No. Medications  No. Follow up visit  No.  Do you have questions or concerns about your Care? No.  Actions: * If pain score is 4 or above: No action needed, pain <4.

## 2014-05-23 ENCOUNTER — Telehealth: Payer: Self-pay | Admitting: Nurse Practitioner

## 2014-05-23 DIAGNOSIS — E785 Hyperlipidemia, unspecified: Secondary | ICD-10-CM

## 2014-05-23 DIAGNOSIS — E038 Other specified hypothyroidism: Secondary | ICD-10-CM

## 2014-05-23 MED ORDER — SIMVASTATIN 40 MG PO TABS: 40.0000 mg | ORAL_TABLET | Freq: Every day | ORAL | Status: DC

## 2014-05-23 MED ORDER — LEVOTHYROXINE SODIUM 100 MCG PO TABS: 100.0000 ug | ORAL_TABLET | Freq: Every day | ORAL | Status: DC

## 2014-05-23 NOTE — Telephone Encounter (Signed)
appt scheduled and med refilled 

## 2014-05-27 ENCOUNTER — Encounter: Payer: Self-pay | Admitting: Internal Medicine

## 2014-05-29 ENCOUNTER — Ambulatory Visit (INDEPENDENT_AMBULATORY_CARE_PROVIDER_SITE_OTHER)
Admission: RE | Admit: 2014-05-29 | Discharge: 2014-05-29 | Disposition: A | Discharge status: Home / Self Care | Payer: BC Managed Care – PPO | Source: Ambulatory Visit | Attending: Internal Medicine | Admitting: Internal Medicine

## 2014-05-29 DIAGNOSIS — K6389 Other specified diseases of intestine: Secondary | ICD-10-CM

## 2014-05-29 DIAGNOSIS — K639 Disease of intestine, unspecified: Secondary | ICD-10-CM

## 2014-05-29 MED ORDER — IOHEXOL 300 MG/ML  SOLN
100.0000 mL | Freq: Once | INTRAMUSCULAR | Status: AC | PRN
  Administered 2014-05-29: 100 mL via INTRAVENOUS

## 2014-07-04 ENCOUNTER — Ambulatory Visit (INDEPENDENT_AMBULATORY_CARE_PROVIDER_SITE_OTHER): Payer: BC Managed Care – PPO | Admitting: Nurse Practitioner

## 2014-07-04 ENCOUNTER — Encounter: Payer: Self-pay | Admitting: Nurse Practitioner

## 2014-07-04 VITALS — BP 122/74 | HR 63 | Temp 97.6°F | Ht 62.5 in | Wt 209.6 lb

## 2014-07-04 DIAGNOSIS — I10 Essential (primary) hypertension: Secondary | ICD-10-CM

## 2014-07-04 DIAGNOSIS — E034 Atrophy of thyroid (acquired): Secondary | ICD-10-CM

## 2014-07-04 DIAGNOSIS — E038 Other specified hypothyroidism: Secondary | ICD-10-CM

## 2014-07-04 DIAGNOSIS — E785 Hyperlipidemia, unspecified: Secondary | ICD-10-CM

## 2014-07-04 DIAGNOSIS — R1900 Intra-abdominal and pelvic swelling, mass and lump, unspecified site: Secondary | ICD-10-CM

## 2014-07-04 MED ORDER — SIMVASTATIN 40 MG PO TABS: 40.0000 mg | ORAL_TABLET | Freq: Every day | ORAL | Status: DC

## 2014-07-04 MED ORDER — LEVOTHYROXINE SODIUM 100 MCG PO TABS: 100.0000 ug | ORAL_TABLET | Freq: Every day | ORAL | Status: DC

## 2014-07-04 MED ORDER — HYDROCHLOROTHIAZIDE 25 MG PO TABS: ORAL_TABLET | ORAL | Status: DC

## 2014-07-04 NOTE — Patient Instructions (Signed)

## 2014-07-04 NOTE — Progress Notes (Signed)
   Subjective:    Patient ID: Madeline Clark, female    DOB: 1956/07/02, 58 y.o.   MRN: [id number]  Patient presents today for followup of chronic illness. No complaints today. However patient had colonoscospy in September which was negative. But she had a mass in her pelvis and he ordered a CT of pelvis- report said proable fibroid but could be ovarian mass. He wants [place] to send her to GYN for u/s.  Hypertension This is a chronic problem. The current episode started more than 1 year ago. The problem has been gradually improving since onset. Pertinent negatives include no chest pain, headaches, palpitations or peripheral edema. Risk factors for coronary artery disease include sedentary lifestyle. Past treatments include diuretics. Compliance problems include exercise and diet.  Hypertensive end-organ damage includes a thyroid problem.  Hyperlipidemia This is a chronic problem. The current episode started more than 1 year ago. The problem is controlled. Exacerbating diseases include hypothyroidism. Pertinent negatives include no chest pain, leg pain or myalgias. She is currently on no antihyperlipidemic treatment. The current treatment provides moderate improvement of lipids. There are no compliance problems.  Risk factors for coronary artery disease include hypertension, obesity and post-menopausal.  Hypothyroidism Synthyroid 100 mcg daily - denies fatigue, heat/cold intolerance, or weight gain/loss    Review of Systems  Constitutional: Negative for fatigue and unexpected weight change.  HENT: Negative.   Cardiovascular: Negative for chest pain and palpitations.  Gastrointestinal: Negative for diarrhea and constipation.  Musculoskeletal: Negative for myalgias.  Neurological: Negative for dizziness and headaches.  Psychiatric/Behavioral: Negative for sleep disturbance.       Objective:   Physical Exam  Constitutional: She is oriented to person, place, and time. She appears well-developed  and well-nourished.  HENT:  Head: Normocephalic.  Right Ear: External ear normal.  Left Ear: External ear normal.  Mouth/Throat: Oropharynx is clear and moist.  Eyes: Pupils are equal, round, and reactive to light.  Neck: Normal range of motion. Neck supple. No thyromegaly present.  Cardiovascular: Normal rate, regular rhythm and normal heart sounds.   Pulmonary/Chest: Effort normal and breath sounds normal.  Abdominal: Soft. Bowel sounds are normal.  Neurological: She is alert and oriented to person, place, and time.  Skin: Skin is warm and dry.  Psychiatric: She has a normal mood and affect. Her behavior is normal. Judgment and thought content normal.   BP 122/74  Pulse 63  Temp(Src) 97.6 F (36.4 C) (Oral)  Ht 5' 2.5" (1.588 m)  Wt 209 lb 9.6 oz (95.074 kg)  BMI 37.70 kg/m2       Assessment & Plan:   1. Essential hypertension, benign Low NA diet - CMP14+EGFR - hydrochlorothiazide (HYDRODIURIL) 25 MG tablet; TAKE ONE (1) TABLET EACH DAY  Dispense: 90 tablet; Refill: 1  2. Hypo levothyroxine (SYNTHROID, LEVOTHROID) 100 MCG tablet; Take 1 tablet (100 mcg total) by mouth daily before breakfast.  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 Hyperlipidemia with target LDL less than 100 Low ffat diet - NMR, lipoprofile - simvastatin (ZOCOR) 40 MG tablet; Take 1 tablet (40 mg total) by mouth at bedtime.  Dispense: 90 tablet; Refill: 1  4. Pelvic mass - Ambulatory referral to Gynecology    Labs pending Health maintenance reviewed Diet and exercise encouraged Continue all meds Follow up  In 6 months   [place], FNP

## 2014-07-05 LAB — THYROID PANEL WITH TSH
Free Thyroxine Index: 3.5 (ref 1.2–4.9)
T3 Uptake Ratio: 31 % (ref 24–39)
T4, Total: 11.4 ug/dL (ref 4.5–12.0)
TSH: 0.382 u[IU]/mL — AB (ref 0.450–4.500)

## 2014-07-05 LAB — CMP14+EGFR
A/G RATIO: 1.8 (ref 1.1–2.5)
ALK PHOS: 71 IU/L (ref 39–117)
ALT: 30 IU/L (ref 0–32)
AST: 23 IU/L (ref 0–40)
Albumin: 4.6 g/dL (ref 3.5–5.5)
BUN / CREAT RATIO: 15 (ref 9–23)
BUN: 14 mg/dL (ref 6–24)
CO2: 25 mmol/L (ref 18–29)
CREATININE: 0.91 mg/dL (ref 0.57–1.00)
Calcium: 9.5 mg/dL (ref 8.7–10.2)
Chloride: 101 mmol/L (ref 97–108)
GFR calc Af Amer: 82 mL/min/{1.73_m2} (ref >59)
GFR, EST NON AFRICAN AMERICAN: 71 mL/min/{1.73_m2} (ref >59)
GLOBULIN, TOTAL: 2.6 g/dL (ref 1.5–4.5)
Glucose: 98 mg/dL (ref 65–99)
Potassium: 4.5 mmol/L (ref 3.5–5.2)
Sodium: 144 mmol/L (ref 134–144)
Total Bilirubin: 0.3 mg/dL (ref 0.0–1.2)
Total Protein: 7.2 g/dL (ref 6.0–8.5)

## 2014-07-05 LAB — NMR, LIPOPROFILE
Cholesterol: 127 mg/dL (ref 100–199)
HDL Cholesterol by NMR: 58 mg/dL (ref >39)
HDL PARTICLE NUMBER: 36.1 umol/L (ref >=30.5)
LDL Particle Number: 674 nmol/L (ref <1000)
LDL Size: 20.7 nm (ref >20.5)
LDL-C: 55 mg/dL (ref 0–99)
LP-IR SCORE: 36 (ref <=45)
Small LDL Particle Number: 285 nmol/L (ref <=527)
Triglycerides by NMR: 72 mg/dL (ref 0–149)

## 2014-07-07 ENCOUNTER — Other Ambulatory Visit: Payer: Self-pay | Admitting: Obstetrics & Gynecology

## 2014-07-08 LAB — CYTOLOGY - PAP

## 2014-07-09 ENCOUNTER — Telehealth: Payer: Self-pay | Admitting: *Deleted

## 2014-07-09 NOTE — Telephone Encounter (Signed)
-----   Message from [facility], [place] sent at 07/06/2014  4:05 PM EST ----- Kidney and liver function stable Cholesterol looks great TSH is slightly low- continue current dose of levothyroxin and lets recheck in 3 months

## 2014-07-10 NOTE — Telephone Encounter (Signed)
Patient aware.

## 2015-01-07 ENCOUNTER — Other Ambulatory Visit: Payer: Self-pay | Admitting: Nurse Practitioner

## 2015-01-07 ENCOUNTER — Encounter: Payer: Self-pay | Admitting: Nurse Practitioner

## 2015-01-07 DIAGNOSIS — I10 Essential (primary) hypertension: Secondary | ICD-10-CM

## 2015-01-07 DIAGNOSIS — E785 Hyperlipidemia, unspecified: Secondary | ICD-10-CM

## 2015-01-07 DIAGNOSIS — E038 Other specified hypothyroidism: Secondary | ICD-10-CM

## 2015-01-07 MED ORDER — SIMVASTATIN 40 MG PO TABS: 40.0000 mg | ORAL_TABLET | Freq: Every day | ORAL | Status: DC

## 2015-01-07 MED ORDER — LEVOTHYROXINE SODIUM 100 MCG PO TABS: 100.0000 ug | ORAL_TABLET | Freq: Every day | ORAL | Status: DC

## 2015-01-07 MED ORDER — HYDROCHLOROTHIAZIDE 25 MG PO TABS: ORAL_TABLET | ORAL | Status: DC

## 2015-04-10 ENCOUNTER — Other Ambulatory Visit: Payer: Self-pay | Admitting: Nurse Practitioner

## 2015-04-10 NOTE — Telephone Encounter (Signed)
Only 30 day supply Patient NTBS for follow up and lab work

## 2015-06-26 ENCOUNTER — Other Ambulatory Visit: Payer: Self-pay | Admitting: Nurse Practitioner

## 2015-06-26 DIAGNOSIS — E785 Hyperlipidemia, unspecified: Secondary | ICD-10-CM

## 2015-06-26 DIAGNOSIS — E038 Other specified hypothyroidism: Secondary | ICD-10-CM

## 2015-06-26 MED ORDER — HYDROCHLOROTHIAZIDE 25 MG PO TABS: ORAL_TABLET | ORAL | Status: DC

## 2015-06-26 MED ORDER — SIMVASTATIN 40 MG PO TABS: 40.0000 mg | ORAL_TABLET | Freq: Every day | ORAL | Status: DC

## 2015-06-26 MED ORDER — LEVOTHYROXINE SODIUM 100 MCG PO TABS
100.0000 ug | ORAL_TABLET | Freq: Every day | ORAL | Status: DC
Start: 2015-06-26 — End: 2015-07-31

## 2015-06-26 NOTE — Telephone Encounter (Signed)
Done for 1 month only

## 2015-06-29 ENCOUNTER — Other Ambulatory Visit: Payer: Self-pay | Admitting: Nurse Practitioner

## 2015-07-03 ENCOUNTER — Other Ambulatory Visit: Payer: Self-pay | Admitting: Nurse Practitioner

## 2015-07-31 ENCOUNTER — Encounter: Payer: Self-pay | Admitting: Nurse Practitioner

## 2015-07-31 ENCOUNTER — Ambulatory Visit (INDEPENDENT_AMBULATORY_CARE_PROVIDER_SITE_OTHER): Payer: BC Managed Care – PPO | Admitting: Nurse Practitioner

## 2015-07-31 VITALS — BP 132/84 | HR 62 | Temp 97.3°F | Ht 62.0 in | Wt 209.0 lb

## 2015-07-31 DIAGNOSIS — E785 Hyperlipidemia, unspecified: Secondary | ICD-10-CM

## 2015-07-31 DIAGNOSIS — I10 Essential (primary) hypertension: Secondary | ICD-10-CM

## 2015-07-31 DIAGNOSIS — Z1159 Encounter for screening for other viral diseases: Secondary | ICD-10-CM | POA: Diagnosis not present

## 2015-07-31 DIAGNOSIS — Z6838 Body mass index (BMI) 38.0-38.9, adult: Secondary | ICD-10-CM

## 2015-07-31 DIAGNOSIS — E038 Other specified hypothyroidism: Secondary | ICD-10-CM

## 2015-07-31 DIAGNOSIS — E669 Obesity, unspecified: Secondary | ICD-10-CM | POA: Insufficient documentation

## 2015-07-31 DIAGNOSIS — E034 Atrophy of thyroid (acquired): Secondary | ICD-10-CM | POA: Diagnosis not present

## 2015-07-31 MED ORDER — SIMVASTATIN 40 MG PO TABS: 40.0000 mg | ORAL_TABLET | Freq: Every day | ORAL | Status: DC

## 2015-07-31 MED ORDER — LEVOTHYROXINE SODIUM 100 MCG PO TABS: 100.0000 ug | ORAL_TABLET | Freq: Every day | ORAL | Status: DC

## 2015-07-31 MED ORDER — HYDROCHLOROTHIAZIDE 25 MG PO TABS: ORAL_TABLET | ORAL | Status: DC

## 2015-07-31 NOTE — Progress Notes (Signed)
Subjective:    Patient ID: Madeline Clark, female    DOB: Nov 29, 1955, 59 y.o.   MRN: [id number]  Patient presents today for followup of chronic illness. No complaints today. However patient had colonoscospy in September which was negative. But she had a mass in her pelvis and he ordered a CT of pelvis- report said proable fibroid but could be ovarian mass. He wants [place] to send her to GYN for u/s.  Hypertension This is a chronic problem. The problem is controlled. Pertinent negatives include no chest pain, headaches or palpitations. Risk factors for coronary artery disease include dyslipidemia, obesity and post-menopausal state. Past treatments include diuretics. The current treatment provides significant improvement. Compliance problems include diet and exercise.  There is no history of CAD/MI or CVA.  Hyperlipidemia This is a chronic problem. The current episode started more than 1 year ago. The problem is uncontrolled. Recent lipid tests were reviewed and are variable. Pertinent negatives include no chest pain or myalgias. Current antihyperlipidemic treatment includes statins. The current treatment provides moderate improvement of lipids. Compliance problems include adherence to diet and adherence to exercise.  Risk factors for coronary artery disease include dyslipidemia, hypertension, obesity and post-menopausal.  Hypothyroidism Synthyroid 100 mcg daily - denies fatigue, heat/cold intolerance, or weight gain/loss    Review of Systems  Constitutional: Negative for fatigue and unexpected weight change.  HENT: Negative.   Cardiovascular: Negative for chest pain and palpitations.  Gastrointestinal: Negative for diarrhea and constipation.  Musculoskeletal: Negative for myalgias.  Neurological: Negative for dizziness and headaches.  Psychiatric/Behavioral: Negative for sleep disturbance.       Objective:   Physical Exam  Constitutional: She is oriented to person, place, and time. She  appears well-developed and well-nourished.  HENT:  Head: Normocephalic.  Right Ear: External ear normal.  Left Ear: External ear normal.  Mouth/Throat: Oropharynx is clear and moist.  Eyes: Pupils are equal, round, and reactive to light.  Neck: Normal range of motion. Neck supple. No thyromegaly present.  Cardiovascular: Normal rate, regular rhythm and normal heart sounds.   Pulmonary/Chest: Effort normal and breath sounds normal.  Abdominal: Soft. Bowel sounds are normal.  Neurological: She is alert and oriented to person, place, and time.  Skin: Skin is warm and dry.  Psychiatric: She has a normal mood and affect. Her behavior is normal. Judgment and thought content normal.   BP 132/84 mmHg  Pulse 62  Temp(Src) 97.3 F (36.3 C) (Oral)  Ht 5\' 2"  (1.575 m)  Wt 209 lb (94.802 kg)  BMI 38.22 kg/m2      Assessment & Plan:   1. BMI 38.0-38.9,adult Discussed diet and exercise for person with BMI >25 Will recheck weight in 3-6 months   2. Essential hypertension, benign Do not add salt to diet - hydrochlorothiazide (HYDRODIURIL) 25 MG tablet; TAKE ONE (1) TABLET EACH DAY  Dispense: 30 tablet; Refill: 5  3. Hypothyroidism due to acquired atrophy of thyroid - Thyroid Panel With TSH - levothyroxine (SYNTHROID, LEVOTHROID) 100 MCG tablet; Take 1 tablet (100 mcg total) by mouth daily before breakfast.  Dispense: 30 tablet; Refill: 5   4. Hyperlipidemia with target LDL less than 100 Low fat diet - simvastatin (ZOCOR) 40 MG tablet; Take 1 tablet (40 mg total) by mouth at bedtime.  Dispense: 30 tablet; Refill: 5  5. Need for hepatitis C screening test - Hepatitis C antibody   Refuses flu shot Encouraged to schedule Mammo Labs pending Health maintenance reviewed Diet and exercise encouraged Continue all  meds Follow up  In 3 months   [place], FNP

## 2015-07-31 NOTE — Patient Instructions (Signed)
Health Maintenance, Female Adopting a healthy lifestyle and getting preventive care can go a long way to promote health and wellness. Talk with your health care provider about what schedule of regular examinations is right for you. This is a good chance for you to check in with your provider about disease prevention and staying healthy. In between checkups, there are plenty of things you can do on your own. Experts have done a lot of research about which lifestyle changes and preventive measures are most likely to keep you healthy. Ask your health care provider for more information. WEIGHT AND DIET  Eat a healthy diet  Be sure to include plenty of vegetables, fruits, low-fat dairy products, and lean protein.  Do not eat a lot of foods high in solid fats, added sugars, or salt.  Get regular exercise. This is one of the most important things you can do for your health.  Most adults should exercise for at least 150 minutes each week. The exercise should increase your heart rate and make you sweat (moderate-intensity exercise).  Most adults should also do strengthening exercises at least twice a week. This is in addition to the moderate-intensity exercise.  Maintain a healthy weight  Body mass index (BMI) is a measurement that can be used to identify possible weight problems. It estimates body fat based on height and weight. Your health care provider can help determine your BMI and help you achieve or maintain a healthy weight.  For females 20 years of age and older:   A BMI below 18.5 is considered underweight.  A BMI of 18.5 to 24.9 is normal.  A BMI of 25 to 29.9 is considered overweight.  A BMI of 30 and above is considered obese.  Watch levels of cholesterol and blood lipids  You should start having your blood tested for lipids and cholesterol at 57 years of age, then have this test every 5 years.  You may need to have your cholesterol levels checked more often if:  Your lipid  or cholesterol levels are high.  You are older than 57 years of age.  You are at high risk for heart disease.  CANCER SCREENING   Lung Cancer  Lung cancer screening is recommended for adults 55-80 years old who are at high risk for lung cancer because of a history of smoking.  A yearly low-dose CT scan of the lungs is recommended for people who:  Currently smoke.  Have quit within the past 15 years.  Have at least a 30-pack-year history of smoking. A pack year is smoking an average of one pack of cigarettes a day for 1 year.  Yearly screening should continue until it has been 15 years since you quit.  Yearly screening should stop if you develop a health problem that would prevent you from having lung cancer treatment.  Breast Cancer  Practice breast self-awareness. This means understanding how your breasts normally appear and feel.  It also means doing regular breast self-exams. Let your health care provider know about any changes, no matter how small.  If you are in your 20s or 30s, you should have a clinical breast exam (CBE) by a health care provider every 1-3 years as part of a regular health exam.  If you are 40 or older, have a CBE every year. Also consider having a breast X-ray (mammogram) every year.  If you have a family history of breast cancer, talk to your health care provider about genetic screening.  If you   are at high risk for breast cancer, talk to your health care provider about having an MRI and a mammogram every year.  Breast cancer gene (BRCA) assessment is recommended for women who have family members with BRCA-related cancers. BRCA-related cancers include:  Breast.  Ovarian.  Tubal.  Peritoneal cancers.  Results of the assessment will determine the need for genetic counseling and BRCA1 and BRCA2 testing. Cervical Cancer Your health care provider may recommend that you be screened regularly for cancer of the pelvic organs (ovaries, uterus, and  vagina). This screening involves a pelvic examination, including checking for microscopic changes to the surface of your cervix (Pap test). You may be encouraged to have this screening done every 3 years, beginning at age 21.  For women ages 30-65, health care providers may recommend pelvic exams and Pap testing every 3 years, or they may recommend the Pap and pelvic exam, combined with testing for human papilloma virus (HPV), every 5 years. Some types of HPV increase your risk of cervical cancer. Testing for HPV may also be done on women of any age with unclear Pap test results.  Other health care providers may not recommend any screening for nonpregnant women who are considered low risk for pelvic cancer and who do not have symptoms. Ask your health care provider if a screening pelvic exam is right for you.  If you have had past treatment for cervical cancer or a condition that could lead to cancer, you need Pap tests and screening for cancer for at least 20 years after your treatment. If Pap tests have been discontinued, your risk factors (such as having a new sexual partner) need to be reassessed to determine if screening should resume. Some women have medical problems that increase the chance of getting cervical cancer. In these cases, your health care provider may recommend more frequent screening and Pap tests. Colorectal Cancer  This type of cancer can be detected and often prevented.  Routine colorectal cancer screening usually begins at 57 years of age and continues through 57 years of age.  Your health care provider may recommend screening at an earlier age if you have risk factors for colon cancer.  Your health care provider may also recommend using home test kits to check for hidden blood in the stool.  A small camera at the end of a tube can be used to examine your colon directly (sigmoidoscopy or colonoscopy). This is done to check for the earliest forms of colorectal  cancer.  Routine screening usually begins at age 50.  Direct examination of the colon should be repeated every 5-10 years through 57 years of age. However, you may need to be screened more often if early forms of precancerous polyps or small growths are found. Skin Cancer  Check your skin from head to toe regularly.  Tell your health care provider about any new moles or changes in moles, especially if there is a change in a mole's shape or color.  Also tell your health care provider if you have a mole that is larger than the size of a pencil eraser.  Always use sunscreen. Apply sunscreen liberally and repeatedly throughout the day.  Protect yourself by wearing long sleeves, pants, a wide-brimmed hat, and sunglasses whenever you are outside. HEART DISEASE, DIABETES, AND HIGH BLOOD PRESSURE   High blood pressure causes heart disease and increases the risk of stroke. High blood pressure is more likely to develop in:  People who have blood pressure in the high end   of the normal range (130-139/85-89 mm Hg).  People who are overweight or obese.  People who are African American.  If you are 38-23 years of age, have your blood pressure checked every 3-5 years. If you are 61 years of age or older, have your blood pressure checked every year. You should have your blood pressure measured twice--once when you are at a hospital or clinic, and once when you are not at a hospital or clinic. Record the average of the two measurements. To check your blood pressure when you are not at a hospital or clinic, you can use:  An automated blood pressure machine at a pharmacy.  A home blood pressure monitor.  If you are between 45 years and 39 years old, ask your health care provider if you should take aspirin to prevent strokes.  Have regular diabetes screenings. This involves taking a blood sample to check your fasting blood sugar level.  If you are at a normal weight and have a low risk for diabetes,  have this test once every three years after 57 years of age.  If you are overweight and have a high risk for diabetes, consider being tested at a younger age or more often. PREVENTING INFECTION  Hepatitis B  If you have a higher risk for hepatitis B, you should be screened for this virus. You are considered at high risk for hepatitis B if:  You were born in a country where hepatitis B is common. Ask your health care provider which countries are considered high risk.  Your parents were born in a high-risk country, and you have not been immunized against hepatitis B (hepatitis B vaccine).  You have HIV or AIDS.  You use needles to inject street drugs.  You live with someone who has hepatitis B.  You have had sex with someone who has hepatitis B.  You get hemodialysis treatment.  You take certain medicines for conditions, including cancer, organ transplantation, and autoimmune conditions. Hepatitis C  Blood testing is recommended for:  Everyone born from [birth year] through 1963.  Anyone with known risk factors for hepatitis C. Sexually transmitted infections (STIs)  You should be screened for sexually transmitted infections (STIs) including gonorrhea and chlamydia if:  You are sexually active and are younger than 57 years of age.  You are older than 57 years of age and your health care provider tells you that you are at risk for this type of infection.  Your sexual activity has changed since you were last screened and you are at an increased risk for chlamydia or gonorrhea. Ask your health care provider if you are at risk.  If you do not have HIV, but are at risk, it may be recommended that you take a prescription medicine daily to prevent HIV infection. This is called pre-exposure prophylaxis (PrEP). You are considered at risk if:  You are sexually active and do not regularly use condoms or know the HIV status of your partner(s).  You take drugs by injection.  You are sexually  active with a partner who has HIV. Talk with your health care provider about whether you are at high risk of being infected with HIV. If you choose to begin PrEP, you should first be tested for HIV. You should then be tested every 3 months for as long as you are taking PrEP.  PREGNANCY   If you are premenopausal and you may become pregnant, ask your health care provider about preconception counseling.  If you may  become pregnant, take 400 to 800 micrograms (mcg) of folic acid every day.  If you want to prevent pregnancy, talk to your health care provider about birth control (contraception). OSTEOPOROSIS AND MENOPAUSE   Osteoporosis is a disease in which the bones lose minerals and strength with aging. This can result in serious bone fractures. Your risk for osteoporosis can be identified using a bone density scan.  If you are 61 years of age or older, or if you are at risk for osteoporosis and fractures, ask your health care provider if you should be screened.  Ask your health care provider whether you should take a calcium or vitamin D supplement to lower your risk for osteoporosis.  Menopause may have certain physical symptoms and risks.  Hormone replacement therapy may reduce some of these symptoms and risks. Talk to your health care provider about whether hormone replacement therapy is right for you.  HOME CARE INSTRUCTIONS   Schedule regular health, dental, and eye exams.  Stay current with your immunizations.   Do not use any tobacco products including cigarettes, chewing tobacco, or electronic cigarettes.  If you are pregnant, do not drink alcohol.  If you are breastfeeding, limit how much and how often you drink alcohol.  Limit alcohol intake to no more than 1 drink per day for nonpregnant women. One drink equals 12 ounces of beer, 5 ounces of wine, or 1 ounces of hard liquor.  Do not use street drugs.  Do not share needles.  Ask your health care provider for help if  you need support or information about quitting drugs.  Tell your health care provider if you often feel depressed.  Tell your health care provider if you have ever been abused or do not feel safe at home.   This information is not intended to replace advice given to you by your health care provider. Make sure you discuss any questions you have with your health care provider.   Document Released: 03/07/2011 Document Revised: 09/12/2014 Document Reviewed: 07/24/2013 Elsevier Interactive Patient Education [employer].

## 2015-08-04 ENCOUNTER — Other Ambulatory Visit: Payer: BC Managed Care – PPO

## 2015-08-04 ENCOUNTER — Encounter (INDEPENDENT_AMBULATORY_CARE_PROVIDER_SITE_OTHER): Payer: Self-pay

## 2015-08-04 NOTE — Progress Notes (Signed)
Lab only 

## 2015-08-05 LAB — THYROID PANEL WITH TSH
Free Thyroxine Index: 3.2 (ref 1.2–4.9)
T3 UPTAKE RATIO: 30 % (ref 24–39)
T4, Total: 10.8 ug/dL (ref 4.5–12.0)
TSH: 0.417 u[IU]/mL — AB (ref 0.450–4.500)

## 2015-08-05 LAB — HEPATITIS C ANTIBODY

## 2015-08-06 ENCOUNTER — Other Ambulatory Visit: Payer: Self-pay | Admitting: Nurse Practitioner

## 2015-08-06 MED ORDER — LEVOTHYROXINE SODIUM 88 MCG PO TABS: 88.0000 ug | ORAL_TABLET | Freq: Every day | ORAL | Status: DC

## 2015-10-19 ENCOUNTER — Telehealth: Payer: Self-pay | Admitting: Nurse Practitioner

## 2015-10-25 IMAGING — CT CT ABD-PELV W/ CM
2 of 5 series · 16 of 46 positions shown, 18 images · IV contrast (Omnipaque 300)
Comparison: No prior imaging available. Colonoscopy report of
05/19/2014 is reviewed.

CLINICAL DATA: Routine colonoscopy demonstrating a cecal lesion on
outside of mucosal.

EXAM:
CT ABDOMEN AND PELVIS WITH CONTRAST
TECHNIQUE: Multidetector CT imaging of the abdomen and pelvis was performed
using the standard protocol following bolus administration of
intravenous contrast.
CONTRAST:  100mL OMNIPAQUE IOHEXOL 300 MG/ML  SOLN

[Series 2: abd/ pel 5mm · axial · 0.75mm/px · z∈[-443,+2]mm · 13 of 99 slices shown, 15 images]
[im 5/99  soft-tissue]
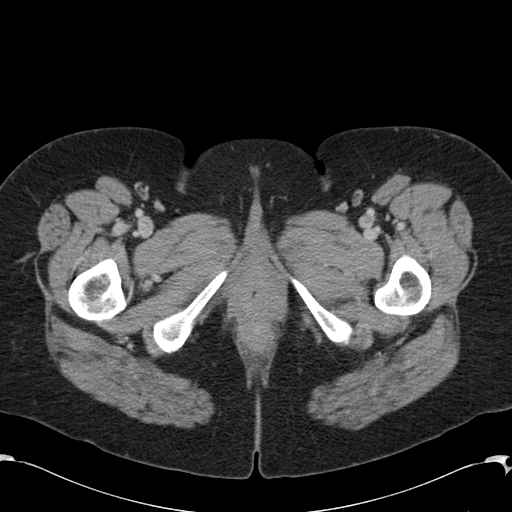
[im 5/99  bone]
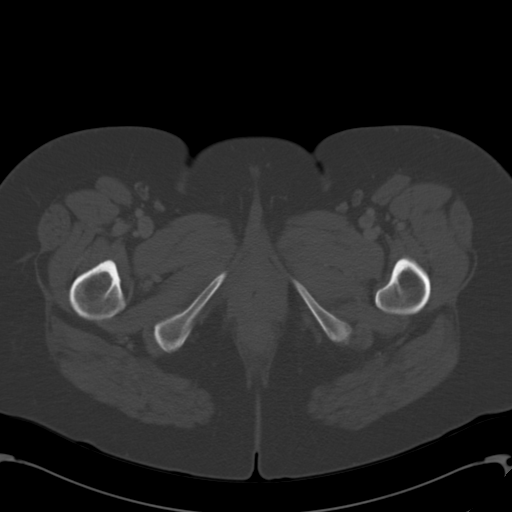
[im 15/99  soft-tissue]
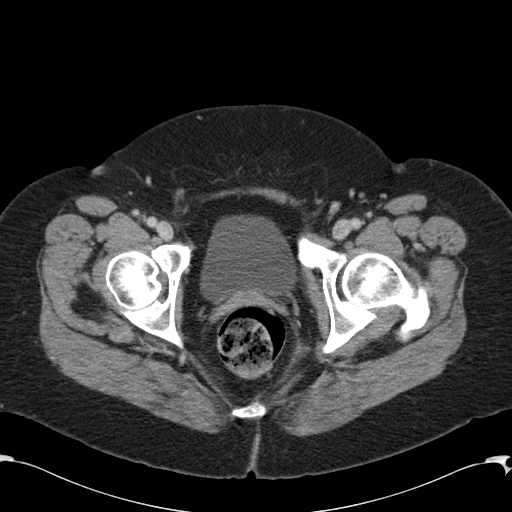
[im 20/99  soft-tissue]
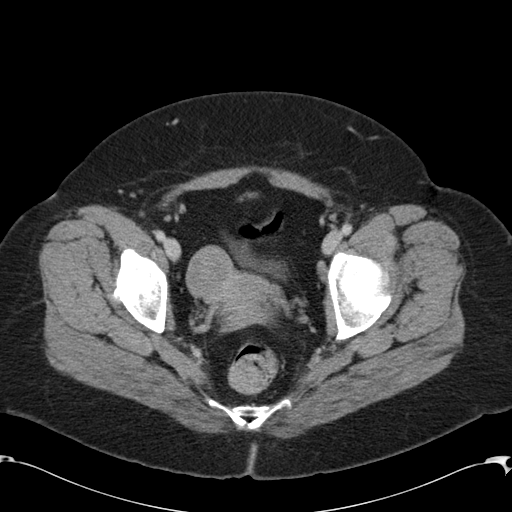
[im 30/99  soft-tissue]
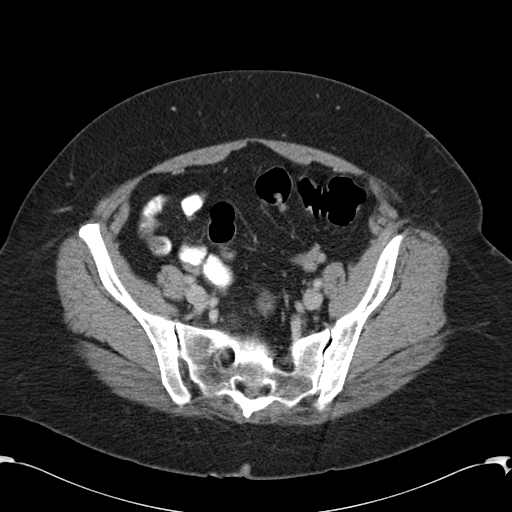
[im 35/99  soft-tissue]
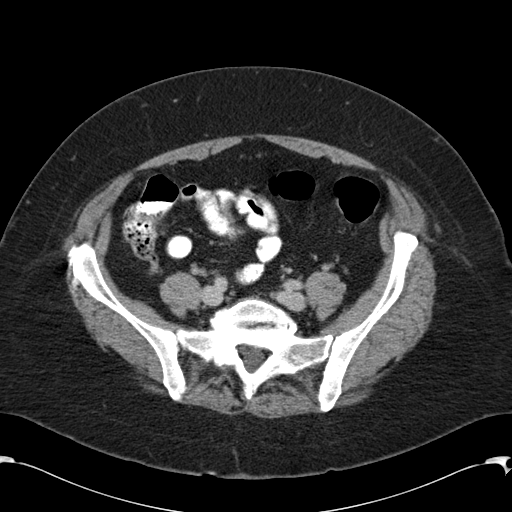
[im 45/99  soft-tissue]
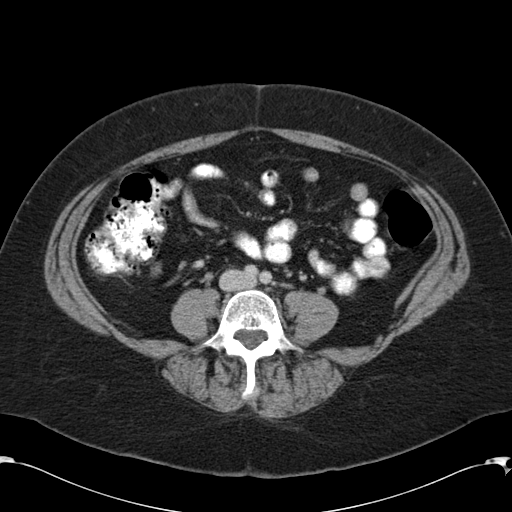
[im 50/99  soft-tissue]
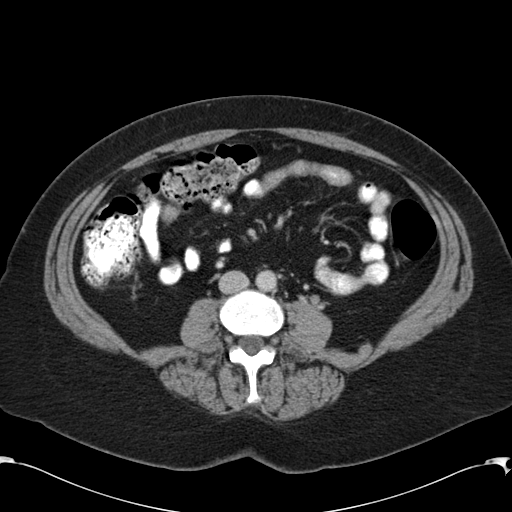
[im 54/99  soft-tissue]
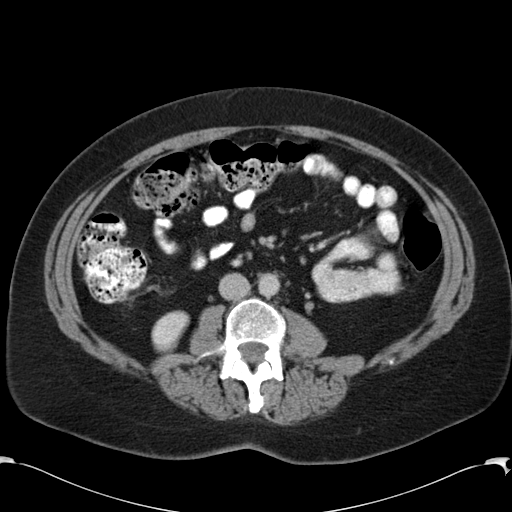
[im 64/99  soft-tissue]
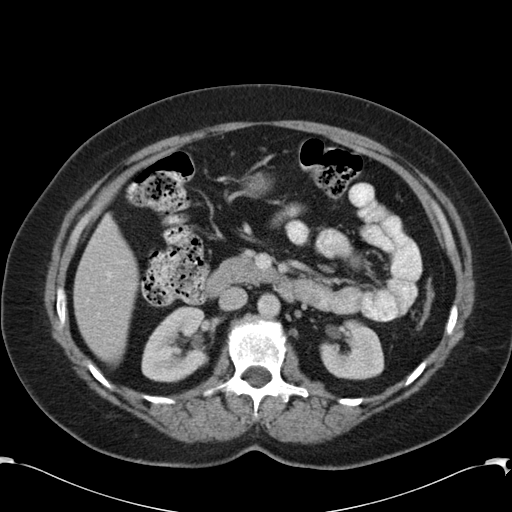
[im 64/99  bone]
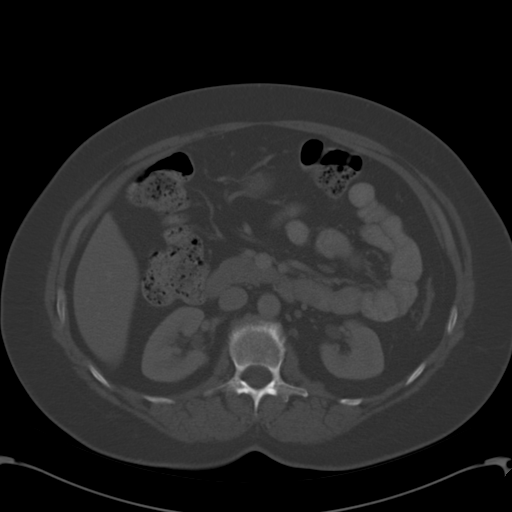
[im 69/99  soft-tissue]
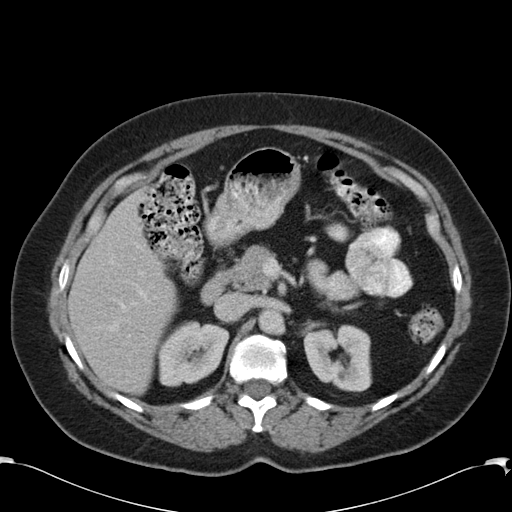
[im 79/99  soft-tissue]
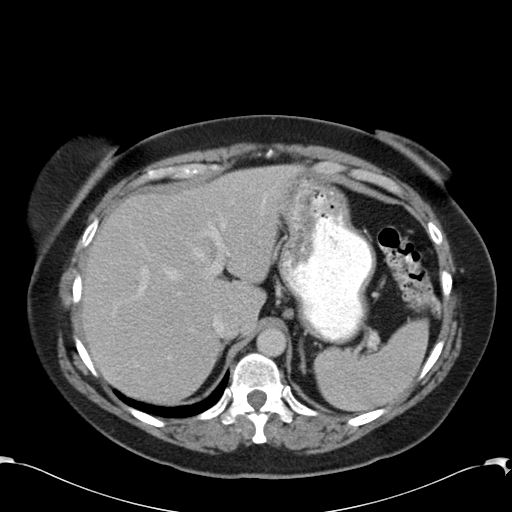
[im 84/99  soft-tissue]
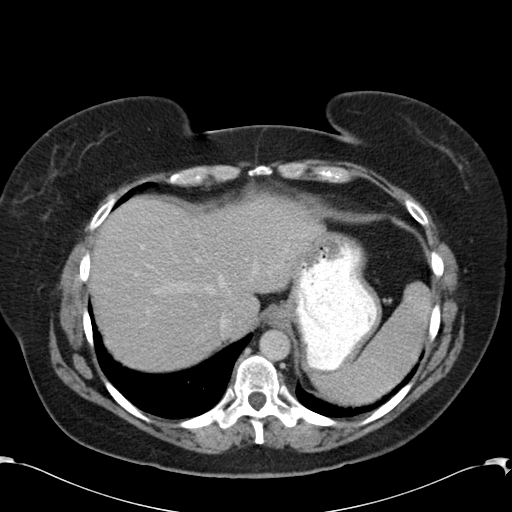
[im 94/99  soft-tissue]
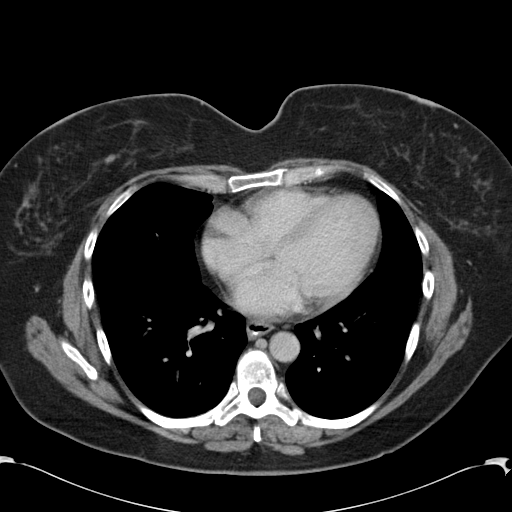

[Series 602: cor · coronal · 0.99mm/px · 3 of 137 slices shown]
[im 46/137  soft-tissue]
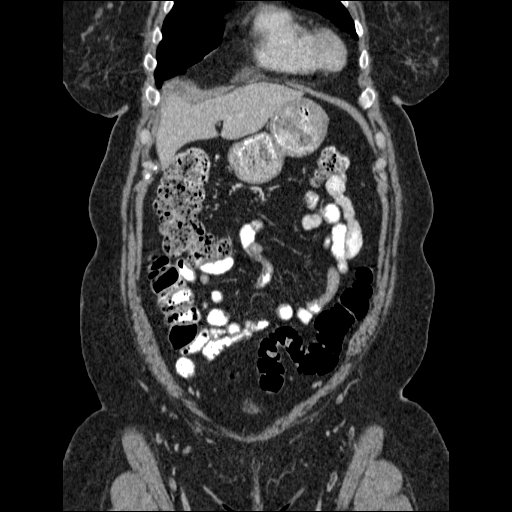
[im 61/137  soft-tissue]
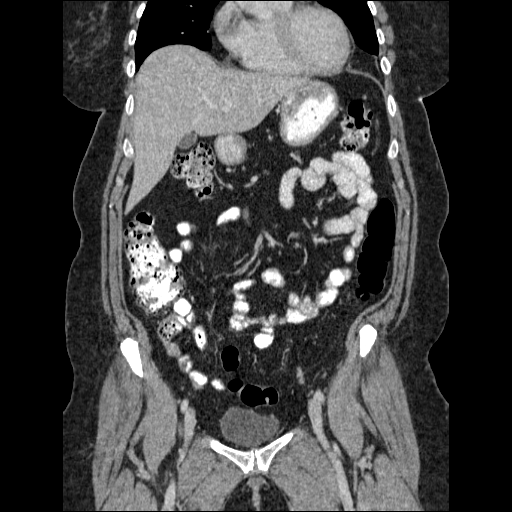
[im 76/137  soft-tissue]
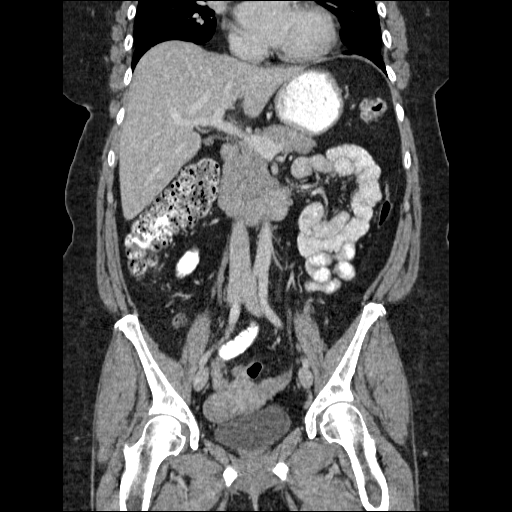

[16 of 46 positions shown; findings below may reference images not displayed]

FINDINGS: Lower chest: Clear lung bases. Heart size upper normal, without
pericardial or pleural effusion.

Hepatobiliary: Normal liver and gallbladder, without biliary ductal
dilatation.

Spleen: Splenule.

Pancreas: Normal, without mass or pancreatic ductal dilatation.

Stomach/Bowel: Normal stomach. Scattered colonic diverticula. There
is no evidence of ascending colonic or cecal mass, including about
the lateral wall (away from the IVC as described in the colonoscopy
report). Normal terminal ileum and appendix. No pericecal
abnormality identified. Normal small bowel without abdominal
ascites.

Adrendals/Urinary tract: Normal adrenal glands. Normal right kidney.
Partially exophytic interpolar left renal lesion. 1.5 cm, including
on image 11 of series 5. This demonstrates macroscopic fat,
consistent with an angiomyolipoma.

Normal ureters and urinary bladder.

Vascular/Lymphatic: Aortic atherosclerosis. No retroperitoneal or
retrocrural adenopathy. No pelvic adenopathy.

Reproductive: Right pelvic soft tissue mass measures 4.0 x 3.4 cm,
including on image 80. Favored to represent an exophytic uterine
fibroid. Right ovary favored to be positioned just cephalad. A
separate fundal fibroid measures 3.1 cm on image 74. Retroverted
uterus. No significant free fluid.

Musculoskeletal: Degenerative disc disease at the lumbosacral
junction.

Other:  None
IMPRESSION: 1. No evidence of cecal or pericecal mass to explain the colonoscopy
abnormality.
2. Uterine fundal fibroid. Right pelvic soft tissue, mass, which is
also favored to represent an exophytic fibroid. Given its position,
immediately adjacent the right adnexa, pelvic ultrasound should be
considered to exclude unlikely ovarian origin. Of note, depending on
patient positioning, the exophytic fibroid may have contacted the
cecum and caused the colonoscopy abnormality.
3. Benign left renal angiomyolipoma.

## 2015-10-26 NOTE — Telephone Encounter (Signed)
DENIED 

## 2015-11-05 ENCOUNTER — Other Ambulatory Visit: Payer: Self-pay

## 2015-11-05 DIAGNOSIS — E039 Hypothyroidism, unspecified: Secondary | ICD-10-CM

## 2015-11-06 ENCOUNTER — Other Ambulatory Visit: Payer: BC Managed Care – PPO

## 2015-11-06 DIAGNOSIS — E039 Hypothyroidism, unspecified: Secondary | ICD-10-CM

## 2015-11-07 LAB — THYROID PANEL WITH TSH
Free Thyroxine Index: 2.5 (ref 1.2–4.9)
T3 Uptake Ratio: 27 % (ref 24–39)
T4 TOTAL: 9.3 ug/dL (ref 4.5–12.0)
TSH: 3.02 u[IU]/mL (ref 0.450–4.500)

## 2015-11-12 ENCOUNTER — Other Ambulatory Visit: Payer: Self-pay | Admitting: *Deleted

## 2015-11-12 DIAGNOSIS — I10 Essential (primary) hypertension: Secondary | ICD-10-CM

## 2015-11-12 DIAGNOSIS — E785 Hyperlipidemia, unspecified: Secondary | ICD-10-CM

## 2015-11-12 MED ORDER — HYDROCHLOROTHIAZIDE 25 MG PO TABS: ORAL_TABLET | ORAL | Status: DC

## 2015-11-12 MED ORDER — LEVOTHYROXINE SODIUM 88 MCG PO TABS: 88.0000 ug | ORAL_TABLET | Freq: Every day | ORAL | Status: DC

## 2015-11-12 MED ORDER — SIMVASTATIN 40 MG PO TABS: 40.0000 mg | ORAL_TABLET | Freq: Every day | ORAL | Status: DC

## 2016-02-17 ENCOUNTER — Telehealth: Payer: Self-pay | Admitting: Nurse Practitioner

## 2016-02-17 ENCOUNTER — Other Ambulatory Visit: Payer: Self-pay

## 2016-02-17 DIAGNOSIS — E785 Hyperlipidemia, unspecified: Secondary | ICD-10-CM

## 2016-02-17 DIAGNOSIS — I10 Essential (primary) hypertension: Secondary | ICD-10-CM

## 2016-02-17 MED ORDER — HYDROCHLOROTHIAZIDE 25 MG PO TABS: ORAL_TABLET | ORAL | Status: DC

## 2016-02-17 MED ORDER — SIMVASTATIN 40 MG PO TABS: 40.0000 mg | ORAL_TABLET | Freq: Every day | ORAL | Status: DC

## 2016-02-17 MED ORDER — LEVOTHYROXINE SODIUM 88 MCG PO TABS: 88.0000 ug | ORAL_TABLET | Freq: Every day | ORAL | Status: DC

## 2016-02-17 NOTE — Telephone Encounter (Signed)
Will you enter orders

## 2016-02-17 NOTE — Telephone Encounter (Signed)
Future orders placed 

## 2016-02-17 NOTE — Telephone Encounter (Signed)
Patient notified that rxs sent to pharmacy 

## 2016-03-16 ENCOUNTER — Ambulatory Visit (INDEPENDENT_AMBULATORY_CARE_PROVIDER_SITE_OTHER): Payer: BC Managed Care – PPO | Admitting: Nurse Practitioner

## 2016-03-16 ENCOUNTER — Encounter: Payer: Self-pay | Admitting: Nurse Practitioner

## 2016-03-16 VITALS — BP 120/82 | HR 77 | Temp 97.2°F | Ht 62.0 in | Wt 221.0 lb

## 2016-03-16 DIAGNOSIS — I10 Essential (primary) hypertension: Secondary | ICD-10-CM

## 2016-03-16 DIAGNOSIS — Z6838 Body mass index (BMI) 38.0-38.9, adult: Secondary | ICD-10-CM

## 2016-03-16 DIAGNOSIS — E038 Other specified hypothyroidism: Secondary | ICD-10-CM

## 2016-03-16 DIAGNOSIS — E785 Hyperlipidemia, unspecified: Secondary | ICD-10-CM

## 2016-03-16 DIAGNOSIS — E034 Atrophy of thyroid (acquired): Secondary | ICD-10-CM

## 2016-03-16 LAB — CMP14+EGFR
ALBUMIN: 4.2 g/dL (ref 3.5–5.5)
ALT: 17 IU/L (ref 0–32)
AST: 19 IU/L (ref 0–40)
Albumin/Globulin Ratio: 1.8 (ref 1.2–2.2)
Alkaline Phosphatase: 67 IU/L (ref 39–117)
BUN/Creatinine Ratio: 14 (ref 9–23)
BUN: 13 mg/dL (ref 6–24)
Bilirubin Total: 0.6 mg/dL (ref 0.0–1.2)
CALCIUM: 9.3 mg/dL (ref 8.7–10.2)
CO2: 27 mmol/L (ref 18–29)
CREATININE: 0.94 mg/dL (ref 0.57–1.00)
Chloride: 102 mmol/L (ref 96–106)
GFR calc non Af Amer: 68 mL/min/{1.73_m2} (ref >59)
GFR, EST AFRICAN AMERICAN: 78 mL/min/{1.73_m2} (ref >59)
Globulin, Total: 2.4 g/dL (ref 1.5–4.5)
Glucose: 100 mg/dL — ABNORMAL HIGH (ref 65–99)
Potassium: 3.8 mmol/L (ref 3.5–5.2)
SODIUM: 146 mmol/L — AB (ref 134–144)
TOTAL PROTEIN: 6.6 g/dL (ref 6.0–8.5)

## 2016-03-16 LAB — LIPID PANEL
Chol/HDL Ratio: 2.3 ratio units (ref 0.0–4.4)
Cholesterol, Total: 110 mg/dL (ref 100–199)
HDL: 47 mg/dL (ref >39)
LDL CALC: 40 mg/dL (ref 0–99)
TRIGLYCERIDES: 117 mg/dL (ref 0–149)
VLDL Cholesterol Cal: 23 mg/dL (ref 5–40)

## 2016-03-16 MED ORDER — LEVOTHYROXINE SODIUM 88 MCG PO TABS: 88.0000 ug | ORAL_TABLET | Freq: Every day | ORAL | Status: DC

## 2016-03-16 MED ORDER — HYDROCHLOROTHIAZIDE 25 MG PO TABS: ORAL_TABLET | ORAL | Status: DC

## 2016-03-16 MED ORDER — SIMVASTATIN 40 MG PO TABS: 40.0000 mg | ORAL_TABLET | Freq: Every day | ORAL | Status: DC

## 2016-03-16 NOTE — Patient Instructions (Signed)
Health Maintenance, Female Adopting a healthy lifestyle and getting preventive care can go a long way to promote health and wellness. Talk with your health care provider about what schedule of regular examinations is right for you. This is a good chance for you to check in with your provider about disease prevention and staying healthy. In between checkups, there are plenty of things you can do on your own. Experts have done a lot of research about which lifestyle changes and preventive measures are most likely to keep you healthy. Ask your health care provider for more information. WEIGHT AND DIET  Eat a healthy diet  Be sure to include plenty of vegetables, fruits, low-fat dairy products, and lean protein.  Do not eat a lot of foods high in solid fats, added sugars, or salt.  Get regular exercise. This is one of the most important things you can do for your health.  Most adults should exercise for at least 150 minutes each week. The exercise should increase your heart rate and make you sweat (moderate-intensity exercise).  Most adults should also do strengthening exercises at least twice a week. This is in addition to the moderate-intensity exercise.  Maintain a healthy weight  Body mass index (BMI) is a measurement that can be used to identify possible weight problems. It estimates body fat based on height and weight. Your health care provider can help determine your BMI and help you achieve or maintain a healthy weight.  For females 20 years of age and older:   A BMI below 18.5 is considered underweight.  A BMI of 18.5 to 24.9 is normal.  A BMI of 25 to 29.9 is considered overweight.  A BMI of 30 and above is considered obese.  Watch levels of cholesterol and blood lipids  You should start having your blood tested for lipids and cholesterol at 58 years of age, then have this test every 5 years.  You may need to have your cholesterol levels checked more often if:  Your lipid  or cholesterol levels are high.  You are older than 58 years of age.  You are at high risk for heart disease.  CANCER SCREENING   Lung Cancer  Lung cancer screening is recommended for adults 55-80 years old who are at high risk for lung cancer because of a history of smoking.  A yearly low-dose CT scan of the lungs is recommended for people who:  Currently smoke.  Have quit within the past 15 years.  Have at least a 30-pack-year history of smoking. A pack year is smoking an average of one pack of cigarettes a day for 1 year.  Yearly screening should continue until it has been 15 years since you quit.  Yearly screening should stop if you develop a health problem that would prevent you from having lung cancer treatment.  Breast Cancer  Practice breast self-awareness. This means understanding how your breasts normally appear and feel.  It also means doing regular breast self-exams. Let your health care provider know about any changes, no matter how small.  If you are in your 20s or 30s, you should have a clinical breast exam (CBE) by a health care provider every 1-3 years as part of a regular health exam.  If you are 40 or older, have a CBE every year. Also consider having a breast X-ray (mammogram) every year.  If you have a family history of breast cancer, talk to your health care provider about genetic screening.  If you   are at high risk for breast cancer, talk to your health care provider about having an MRI and a mammogram every year.  Breast cancer gene (BRCA) assessment is recommended for women who have family members with BRCA-related cancers. BRCA-related cancers include:  Breast.  Ovarian.  Tubal.  Peritoneal cancers.  Results of the assessment will determine the need for genetic counseling and BRCA1 and BRCA2 testing. Cervical Cancer Your health care provider may recommend that you be screened regularly for cancer of the pelvic organs (ovaries, uterus, and  vagina). This screening involves a pelvic examination, including checking for microscopic changes to the surface of your cervix (Pap test). You may be encouraged to have this screening done every 3 years, beginning at age 21.  For women ages 30-65, health care providers may recommend pelvic exams and Pap testing every 3 years, or they may recommend the Pap and pelvic exam, combined with testing for human papilloma virus (HPV), every 5 years. Some types of HPV increase your risk of cervical cancer. Testing for HPV may also be done on women of any age with unclear Pap test results.  Other health care providers may not recommend any screening for nonpregnant women who are considered low risk for pelvic cancer and who do not have symptoms. Ask your health care provider if a screening pelvic exam is right for you.  If you have had past treatment for cervical cancer or a condition that could lead to cancer, you need Pap tests and screening for cancer for at least 20 years after your treatment. If Pap tests have been discontinued, your risk factors (such as having a new sexual partner) need to be reassessed to determine if screening should resume. Some women have medical problems that increase the chance of getting cervical cancer. In these cases, your health care provider may recommend more frequent screening and Pap tests. Colorectal Cancer  This type of cancer can be detected and often prevented.  Routine colorectal cancer screening usually begins at 58 years of age and continues through 58 years of age.  Your health care provider may recommend screening at an earlier age if you have risk factors for colon cancer.  Your health care provider may also recommend using home test kits to check for hidden blood in the stool.  A small camera at the end of a tube can be used to examine your colon directly (sigmoidoscopy or colonoscopy). This is done to check for the earliest forms of colorectal  cancer.  Routine screening usually begins at age 50.  Direct examination of the colon should be repeated every 5-10 years through 58 years of age. However, you may need to be screened more often if early forms of precancerous polyps or small growths are found. Skin Cancer  Check your skin from head to toe regularly.  Tell your health care provider about any new moles or changes in moles, especially if there is a change in a mole's shape or color.  Also tell your health care provider if you have a mole that is larger than the size of a pencil eraser.  Always use sunscreen. Apply sunscreen liberally and repeatedly throughout the day.  Protect yourself by wearing long sleeves, pants, a wide-brimmed hat, and sunglasses whenever you are outside. HEART DISEASE, DIABETES, AND HIGH BLOOD PRESSURE   High blood pressure causes heart disease and increases the risk of stroke. High blood pressure is more likely to develop in:  People who have blood pressure in the high end   of the normal range (130-139/85-89 mm Hg).  People who are overweight or obese.  People who are African American.  If you are 38-23 years of age, have your blood pressure checked every 3-5 years. If you are 61 years of age or older, have your blood pressure checked every year. You should have your blood pressure measured twice--once when you are at a hospital or clinic, and once when you are not at a hospital or clinic. Record the average of the two measurements. To check your blood pressure when you are not at a hospital or clinic, you can use:  An automated blood pressure machine at a pharmacy.  A home blood pressure monitor.  If you are between 45 years and 39 years old, ask your health care provider if you should take aspirin to prevent strokes.  Have regular diabetes screenings. This involves taking a blood sample to check your fasting blood sugar level.  If you are at a normal weight and have a low risk for diabetes,  have this test once every three years after 58 years of age.  If you are overweight and have a high risk for diabetes, consider being tested at a younger age or more often. PREVENTING INFECTION  Hepatitis B  If you have a higher risk for hepatitis B, you should be screened for this virus. You are considered at high risk for hepatitis B if:  You were born in a country where hepatitis B is common. Ask your health care provider which countries are considered high risk.  Your parents were born in a high-risk country, and you have not been immunized against hepatitis B (hepatitis B vaccine).  You have HIV or AIDS.  You use needles to inject street drugs.  You live with someone who has hepatitis B.  You have had sex with someone who has hepatitis B.  You get hemodialysis treatment.  You take certain medicines for conditions, including cancer, organ transplantation, and autoimmune conditions. Hepatitis C  Blood testing is recommended for:  Everyone born from [birth year] through 1963.  Anyone with known risk factors for hepatitis C. Sexually transmitted infections (STIs)  You should be screened for sexually transmitted infections (STIs) including gonorrhea and chlamydia if:  You are sexually active and are younger than 58 years of age.  You are older than 58 years of age and your health care provider tells you that you are at risk for this type of infection.  Your sexual activity has changed since you were last screened and you are at an increased risk for chlamydia or gonorrhea. Ask your health care provider if you are at risk.  If you do not have HIV, but are at risk, it may be recommended that you take a prescription medicine daily to prevent HIV infection. This is called pre-exposure prophylaxis (PrEP). You are considered at risk if:  You are sexually active and do not regularly use condoms or know the HIV status of your partner(s).  You take drugs by injection.  You are sexually  active with a partner who has HIV. Talk with your health care provider about whether you are at high risk of being infected with HIV. If you choose to begin PrEP, you should first be tested for HIV. You should then be tested every 3 months for as long as you are taking PrEP.  PREGNANCY   If you are premenopausal and you may become pregnant, ask your health care provider about preconception counseling.  If you may  become pregnant, take 400 to 800 micrograms (mcg) of folic acid every day.  If you want to prevent pregnancy, talk to your health care provider about birth control (contraception). OSTEOPOROSIS AND MENOPAUSE   Osteoporosis is a disease in which the bones lose minerals and strength with aging. This can result in serious bone fractures. Your risk for osteoporosis can be identified using a bone density scan.  If you are 29 years of age or older, or if you are at risk for osteoporosis and fractures, ask your health care provider if you should be screened.  Ask your health care provider whether you should take a calcium or vitamin D supplement to lower your risk for osteoporosis.  Menopause may have certain physical symptoms and risks.  Hormone replacement therapy may reduce some of these symptoms and risks. Talk to your health care provider about whether hormone replacement therapy is right for you.  HOME CARE INSTRUCTIONS   Schedule regular health, dental, and eye exams.  Stay current with your immunizations.   Do not use any tobacco products including cigarettes, chewing tobacco, or electronic cigarettes.  If you are pregnant, do not drink alcohol.  If you are breastfeeding, limit how much and how often you drink alcohol.  Limit alcohol intake to no more than 1 drink per day for nonpregnant women. One drink equals 12 ounces of beer, 5 ounces of wine, or 1 ounces of hard liquor.  Do not use street drugs.  Do not share needles.  Ask your health care provider for help if  you need support or information about quitting drugs.  Tell your health care provider if you often feel depressed.  Tell your health care provider if you have ever been abused or do not feel safe at home.   This information is not intended to replace advice given to you by your health care provider. Make sure you discuss any questions you have with your health care provider.   Document Released: 03/07/2011 Document Revised: 09/12/2014 Document Reviewed: 07/24/2013 Elsevier Interactive Patient Education [employer].

## 2016-03-16 NOTE — Progress Notes (Signed)
Subjective:    Patient ID: Madeline Clark, female    DOB: [birth year], 58 y.o.   MRN: [id number]  Patient here today for follow up of chronic medical problems. Patient has no complaints today.  Outpatient Encounter Prescriptions as of 03/16/2016  Medication Sig  . hydrochlorothiazide (HYDRODIURIL) 25 MG tablet TAKE ONE (1) TABLET EACH DAY  . levothyroxine (SYNTHROID, LEVOTHROID) 88 MCG tablet Take 1 tablet (88 mcg total) by mouth daily.  . simvastatin (ZOCOR) 40 MG tablet Take 1 tablet (40 mg total) by mouth at bedtime.   No facility-administered encounter medications on file as of 03/16/2016.      Hypertension This is a chronic problem. The problem is controlled. Pertinent negatives include no chest pain, headaches or palpitations. Risk factors for coronary artery disease include dyslipidemia, obesity and post-menopausal state. Past treatments include diuretics. The current treatment provides significant improvement. Compliance problems include diet and exercise.  There is no history of CAD/MI or CVA.  Hyperlipidemia This is a chronic problem. The current episode started more than 1 year ago. The problem is uncontrolled. Recent lipid tests were reviewed and are variable. Pertinent negatives include no chest pain or myalgias. Current antihyperlipidemic treatment includes statins. The current treatment provides moderate improvement of lipids. Compliance problems include adherence to diet and adherence to exercise.  Risk factors for coronary artery disease include dyslipidemia, hypertension, obesity and post-menopausal.  Hypothyroidism Synthyroid 100 mcg daily - denies fatigue, heat/cold intolerance, or weight gain/loss    Review of Systems  Constitutional: Negative for fatigue and unexpected weight change.  HENT: Negative.   Cardiovascular: Negative for chest pain and palpitations.  Gastrointestinal: Negative for diarrhea and constipation.  Musculoskeletal: Negative for myalgias.    Neurological: Negative for dizziness and headaches.  Psychiatric/Behavioral: Negative for sleep disturbance.       Objective:   Physical Exam  Constitutional: She is oriented to person, place, and time. She appears well-developed and well-nourished.  HENT:  Head: Normocephalic.  Right Ear: External ear normal.  Left Ear: External ear normal.  Mouth/Throat: Oropharynx is clear and moist.  Eyes: Pupils are equal, round, and reactive to light.  Neck: Normal range of motion. Neck supple. No thyromegaly present.  Cardiovascular: Normal rate, regular rhythm and normal heart sounds.   Pulmonary/Chest: Effort normal and breath sounds normal.  Abdominal: Soft. Bowel sounds are normal.  Neurological: She is alert and oriented to person, place, and time.  Skin: Skin is warm and dry.  Psychiatric: She has a normal mood and affect. Her behavior is normal. Judgment and thought content normal.   BP 120/82 mmHg  Pulse 77  Temp(Src) 97.2 F (36.2 C) (Oral)  Ht 5' 2"  (1.575 m)  Wt 221 lb (100.245 kg)  BMI 40.41 kg/m2       Assessment & Plan:   1. Essential hypertension, benign Do not add salt to diet - hydrochlorothiazide (HYDRODIURIL) 25 MG tablet; TAKE ONE (1) TABLET EACH DAY  Dispense: 90 tablet; Refill: 1 - CMP14+EGFR  2. Hyperlipidemia with target LDL less than 100 Low fat diet - simvastatin (ZOCOR) 40 MG tablet; Take 1 tablet (40 mg total) by mouth at bedtime.  Dispense: 90 tablet; Refill: 1 - Lipid panel  3. Hypothyroidism due to acquired atrophy of thyroid - levothyroxine (SYNTHROID, LEVOTHROID) 88 MCG tablet; Take 1 tablet (88 mcg total) by mouth daily.  Dispense: 90 tablet; Refill: 1 - Thyroid Panel With TSH  4. BMI 38.0-38.9,adult Discussed diet and exercise for person with BMI >25 Will recheck  weight in 3-6 months    Labs pending Health maintenance reviewed Diet and exercise encouraged Continue all meds Follow up  In 6 months   [place],  FNP

## 2016-08-26 ENCOUNTER — Other Ambulatory Visit: Payer: Self-pay | Admitting: Nurse Practitioner

## 2016-08-26 DIAGNOSIS — E785 Hyperlipidemia, unspecified: Secondary | ICD-10-CM

## 2016-08-26 DIAGNOSIS — E034 Atrophy of thyroid (acquired): Secondary | ICD-10-CM

## 2016-08-26 DIAGNOSIS — I10 Essential (primary) hypertension: Secondary | ICD-10-CM

## 2016-11-29 ENCOUNTER — Encounter: Payer: Self-pay | Admitting: Family Medicine

## 2016-11-29 ENCOUNTER — Ambulatory Visit (INDEPENDENT_AMBULATORY_CARE_PROVIDER_SITE_OTHER): Payer: BC Managed Care – PPO | Admitting: Family Medicine

## 2016-11-29 DIAGNOSIS — E785 Hyperlipidemia, unspecified: Secondary | ICD-10-CM

## 2016-11-29 DIAGNOSIS — E034 Atrophy of thyroid (acquired): Secondary | ICD-10-CM

## 2016-11-29 DIAGNOSIS — I10 Essential (primary) hypertension: Secondary | ICD-10-CM

## 2016-11-29 MED ORDER — HYDROCHLOROTHIAZIDE 25 MG PO TABS: ORAL_TABLET | ORAL | 3 refills | Status: DC

## 2016-11-29 MED ORDER — LEVOTHYROXINE SODIUM 88 MCG PO TABS: ORAL_TABLET | ORAL | 3 refills | Status: DC

## 2016-11-29 MED ORDER — SIMVASTATIN 40 MG PO TABS
40.0000 mg | ORAL_TABLET | Freq: Every day | ORAL | 3 refills | Status: DC
Start: 2016-11-29 — End: 2017-12-26

## 2016-11-29 NOTE — Progress Notes (Signed)
   HPI  Patient presents today here to follow-up for chronic medical conditions.  Hypertension Doing well Good medication compliance No chest pain, dyspnea, palpitations, leg edema, headaches.  Hyperlipidemia Fasting today No side effects from simvastatin.  Hypothyroidism Good medication compliance. No concerning symptoms  PMH: Smoking status noted ROS: Per HPI  Objective: BP 129/78   Pulse 79   Temp 97.5 F (36.4 C) (Oral)   Ht _0  (1.575 m)   Wt 218 lb (98.9 kg)   BMI 39.87 kg/m  Gen: NAD, alert, cooperative with exam HEENT: NCAT CV: RRR, good S1/S2, no murmur Resp: CTABL, no wheezes, non-labored Ext: No edema, warm Neuro: Alert and oriented, No gross deficits  Assessment and plan:  # Hypertension Well-controlled on HCTZ Refilled medication Labs  # Hyperlipidemia Previously well-controlled about 9 months ago, repeat labs, continue simvastatin  # Hypothyroidism Repeat labs, continue Synthroid at current dose   Orders Placed This Encounter  Procedures  . CMP14+EGFR  . CBC with Differential/Platelet  . Lipid panel  . TSH  . T3, Free  . T4, Free    Meds ordered this encounter  Medications  . hydrochlorothiazide (HYDRODIURIL) 25 MG tablet    Sig: TAKE ONE (1) TABLET EACH DAY    Dispense:  90 tablet    Refill:  3  . simvastatin (ZOCOR) 40 MG tablet    Sig: Take 1 tablet (40 mg total) by mouth at bedtime.    Dispense:  90 tablet    Refill:  3    Needs to be seen for more refills  . levothyroxine (SYNTHROID, LEVOTHROID) 88 MCG tablet    Sig: TAKE ONE (1) TABLET EACH DAY    Dispense:  90 tablet    Refill:  3    Laroy Apple, MD [facility] 11/29/2016, 8:28 AM

## 2016-11-29 NOTE — Patient Instructions (Signed)
Great to meet you!   Lets plan to see you in 6 months unless you need [place]  sooner.

## 2016-11-30 ENCOUNTER — Other Ambulatory Visit: Payer: Self-pay | Admitting: Family Medicine

## 2016-11-30 DIAGNOSIS — E034 Atrophy of thyroid (acquired): Secondary | ICD-10-CM

## 2016-11-30 LAB — LIPID PANEL
CHOLESTEROL TOTAL: 127 mg/dL (ref 100–199)
Chol/HDL Ratio: 2.4 ratio units (ref 0.0–4.4)
HDL: 52 mg/dL (ref >39)
LDL Calculated: 56 mg/dL (ref 0–99)
TRIGLYCERIDES: 94 mg/dL (ref 0–149)
VLDL Cholesterol Cal: 19 mg/dL (ref 5–40)

## 2016-11-30 LAB — CBC WITH DIFFERENTIAL/PLATELET
BASOS ABS: 0.1 10*3/uL (ref 0.0–0.2)
BASOS: 1 %
EOS (ABSOLUTE): 0.3 10*3/uL (ref 0.0–0.4)
Eos: 4 %
Hematocrit: 43.1 % (ref 34.0–46.6)
Hemoglobin: 14 g/dL (ref 11.1–15.9)
IMMATURE GRANS (ABS): 0.1 10*3/uL (ref 0.0–0.1)
IMMATURE GRANULOCYTES: 1 %
Lymphocytes Absolute: 2.1 10*3/uL (ref 0.7–3.1)
Lymphs: 29 %
MCH: 29.4 pg (ref 26.6–33.0)
MCHC: 32.5 g/dL (ref 31.5–35.7)
MCV: 90 fL (ref 79–97)
MONOS ABS: 0.5 10*3/uL (ref 0.1–0.9)
Monocytes: 7 %
NEUTROS ABS: 4 10*3/uL (ref 1.4–7.0)
NEUTROS PCT: 58 %
Platelets: 285 10*3/uL (ref 150–379)
RBC: 4.77 x10E6/uL (ref 3.77–5.28)
RDW: 13.1 % (ref 12.3–15.4)
WBC: 7 10*3/uL (ref 3.4–10.8)

## 2016-11-30 LAB — CMP14+EGFR
A/G RATIO: 1.7 (ref 1.2–2.2)
ALT: 15 IU/L (ref 0–32)
AST: 17 IU/L (ref 0–40)
Albumin: 4.3 g/dL (ref 3.5–5.5)
Alkaline Phosphatase: 72 IU/L (ref 39–117)
BILIRUBIN TOTAL: 0.7 mg/dL (ref 0.0–1.2)
BUN/Creatinine Ratio: 15 (ref 9–23)
BUN: 14 mg/dL (ref 6–24)
CALCIUM: 9.2 mg/dL (ref 8.7–10.2)
CHLORIDE: 101 mmol/L (ref 96–106)
CO2: 26 mmol/L (ref 18–29)
Creatinine, Ser: 0.96 mg/dL (ref 0.57–1.00)
GFR calc Af Amer: 75 mL/min/{1.73_m2} (ref >59)
GFR, EST NON AFRICAN AMERICAN: 65 mL/min/{1.73_m2} (ref >59)
GLOBULIN, TOTAL: 2.5 g/dL (ref 1.5–4.5)
Glucose: 103 mg/dL — ABNORMAL HIGH (ref 65–99)
POTASSIUM: 4.1 mmol/L (ref 3.5–5.2)
SODIUM: 143 mmol/L (ref 134–144)
Total Protein: 6.8 g/dL (ref 6.0–8.5)

## 2016-11-30 LAB — TSH: TSH: 6.41 u[IU]/mL — ABNORMAL HIGH (ref 0.450–4.500)

## 2016-11-30 LAB — T4, FREE: FREE T4: 1.3 ng/dL (ref 0.82–1.77)

## 2016-11-30 LAB — T3, FREE: T3, Free: 2.4 pg/mL (ref 2.0–4.4)

## 2016-11-30 MED ORDER — LEVOTHYROXINE SODIUM 100 MCG PO TABS: ORAL_TABLET | ORAL | 3 refills | Status: DC

## 2016-12-01 LAB — SPECIMEN STATUS REPORT

## 2016-12-01 LAB — HGB A1C W/O EAG: Hgb A1c MFr Bld: 5.3 % (ref 4.8–5.6)

## 2017-05-31 ENCOUNTER — Other Ambulatory Visit: Payer: Self-pay | Admitting: Family Medicine

## 2017-05-31 ENCOUNTER — Telehealth: Payer: Self-pay | Admitting: Family Medicine

## 2017-05-31 DIAGNOSIS — E034 Atrophy of thyroid (acquired): Secondary | ICD-10-CM

## 2017-05-31 DIAGNOSIS — I10 Essential (primary) hypertension: Secondary | ICD-10-CM

## 2017-05-31 NOTE — Telephone Encounter (Signed)
Lab orders placed.   Laroy Apple, MD [facility] 05/31/2017, 1:23 PM

## 2017-05-31 NOTE — Telephone Encounter (Signed)
Pt notified of labs Verbalizes understanding 

## 2017-06-01 ENCOUNTER — Other Ambulatory Visit: Payer: BC Managed Care – PPO

## 2017-06-01 DIAGNOSIS — I10 Essential (primary) hypertension: Secondary | ICD-10-CM

## 2017-06-01 DIAGNOSIS — E034 Atrophy of thyroid (acquired): Secondary | ICD-10-CM

## 2017-06-02 ENCOUNTER — Encounter: Payer: Self-pay | Admitting: Family Medicine

## 2017-06-02 ENCOUNTER — Ambulatory Visit (INDEPENDENT_AMBULATORY_CARE_PROVIDER_SITE_OTHER): Payer: BC Managed Care – PPO | Admitting: Family Medicine

## 2017-06-02 VITALS — BP 136/88 | HR 60 | Temp 97.0°F | Ht 62.0 in | Wt 218.6 lb

## 2017-06-02 DIAGNOSIS — E034 Atrophy of thyroid (acquired): Secondary | ICD-10-CM

## 2017-06-02 DIAGNOSIS — E785 Hyperlipidemia, unspecified: Secondary | ICD-10-CM | POA: Diagnosis not present

## 2017-06-02 DIAGNOSIS — I10 Essential (primary) hypertension: Secondary | ICD-10-CM

## 2017-06-02 LAB — CMP14+EGFR
ALK PHOS: 64 IU/L (ref 39–117)
ALT: 17 IU/L (ref 0–32)
AST: 16 IU/L (ref 0–40)
Albumin/Globulin Ratio: 2 (ref 1.2–2.2)
Albumin: 4.7 g/dL (ref 3.5–5.5)
BUN / CREAT RATIO: 12 (ref 9–23)
BUN: 12 mg/dL (ref 6–24)
Bilirubin Total: 0.6 mg/dL (ref 0.0–1.2)
CALCIUM: 9.7 mg/dL (ref 8.7–10.2)
CO2: 25 mmol/L (ref 20–29)
CREATININE: 1.04 mg/dL — AB (ref 0.57–1.00)
Chloride: 103 mmol/L (ref 96–106)
GFR calc Af Amer: 68 mL/min/{1.73_m2} (ref >59)
GFR, EST NON AFRICAN AMERICAN: 59 mL/min/{1.73_m2} — AB (ref >59)
GLOBULIN, TOTAL: 2.3 g/dL (ref 1.5–4.5)
Glucose: 98 mg/dL (ref 65–99)
Potassium: 4.4 mmol/L (ref 3.5–5.2)
SODIUM: 144 mmol/L (ref 134–144)
TOTAL PROTEIN: 7 g/dL (ref 6.0–8.5)

## 2017-06-02 LAB — TSH: TSH: 2.14 u[IU]/mL (ref 0.450–4.500)

## 2017-06-02 NOTE — Progress Notes (Signed)
   HPI  Patient presents today here for follow-up of chronic medical conditions.  Hypertension Good medication compliance No side effects  Hyperlipidemia Good medication compliance No side effects Watching diet moderately.  Hypothyroidism Asymptomatic, doing well with Synthroid.   PMH: Smoking status noted ROS: Per HPI  Objective: BP 136/88   Pulse 60   Temp (!) 97 F (36.1 C) (Oral)   Ht 5\' 2"  (1.575 m)   Wt 218 lb 9.6 oz (99.2 kg)   BMI 39.98 kg/m  Gen: NAD, alert, cooperative with exam HEENT: NCAT, EOMI, PERRL TMs normal bilaterally, oropharynx clear and moist, nares clear Neck: No thyromegaly CV: RRR, good S1/S2, no murmur Resp: CTABL, no wheezes, non-labored Ext: No edema, warm Neuro: Alert and oriented, No gross deficits  Assessment and plan:  # Hypothyroidism TSH stable No changes, clinically asymptomatic  # Hypertension Well-controlled No changes Labs up-to-date and reviewed  # Hyperlipidemia Labs are up-to-date, plain annual labs. Good medication compliance and tolerance No changes  Recommended mammogram, patient will consider  Laroy Apple, MD [facility] 06/02/2017, 8:40 AM

## 2017-10-20 ENCOUNTER — Other Ambulatory Visit: Payer: Self-pay | Admitting: Family Medicine

## 2017-10-20 DIAGNOSIS — E034 Atrophy of thyroid (acquired): Secondary | ICD-10-CM

## 2017-12-15 ENCOUNTER — Other Ambulatory Visit: Payer: Self-pay | Admitting: Family Medicine

## 2017-12-15 DIAGNOSIS — I10 Essential (primary) hypertension: Secondary | ICD-10-CM

## 2017-12-26 ENCOUNTER — Other Ambulatory Visit: Payer: Self-pay | Admitting: Family Medicine

## 2017-12-26 DIAGNOSIS — E785 Hyperlipidemia, unspecified: Secondary | ICD-10-CM

## 2018-08-18 ENCOUNTER — Other Ambulatory Visit: Payer: Self-pay | Admitting: Nurse Practitioner

## 2018-08-18 DIAGNOSIS — I10 Essential (primary) hypertension: Secondary | ICD-10-CM

## 2018-11-26 ENCOUNTER — Ambulatory Visit: Payer: BC Managed Care – PPO | Admitting: Family

## 2018-11-26 ENCOUNTER — Other Ambulatory Visit: Payer: Self-pay

## 2018-11-26 ENCOUNTER — Encounter: Payer: Self-pay | Admitting: Family

## 2018-11-26 VITALS — BP 128/83 | HR 67 | Temp 98.1°F | Ht 62.0 in | Wt 217.6 lb

## 2018-11-26 DIAGNOSIS — E785 Hyperlipidemia, unspecified: Secondary | ICD-10-CM

## 2018-11-26 DIAGNOSIS — M1990 Unspecified osteoarthritis, unspecified site: Secondary | ICD-10-CM | POA: Insufficient documentation

## 2018-11-26 DIAGNOSIS — E669 Obesity, unspecified: Secondary | ICD-10-CM | POA: Diagnosis not present

## 2018-11-26 DIAGNOSIS — E034 Atrophy of thyroid (acquired): Secondary | ICD-10-CM | POA: Diagnosis not present

## 2018-11-26 DIAGNOSIS — I10 Essential (primary) hypertension: Secondary | ICD-10-CM | POA: Diagnosis not present

## 2018-11-26 DIAGNOSIS — M19041 Primary osteoarthritis, right hand: Secondary | ICD-10-CM

## 2018-11-26 MED ORDER — NAPROXEN 500 MG PO TABS: 500.0000 mg | ORAL_TABLET | Freq: Two times a day (BID) | ORAL | 1 refills | Status: DC

## 2018-11-26 NOTE — Progress Notes (Signed)
Subjective:    Patient ID: Madeline Clark, female    DOB: 1956/05/24, 63 y.o.   MRN: [id number]  Chief Complaint  Patient presents with  . Medical Management of Chronic Issues    refills and lab work   Pt presents to the office today for chronic follow up.  Hypertension  This is a chronic problem. The current episode started more than 1 year ago. The problem has been resolved since onset. The problem is controlled. Associated symptoms include malaise/fatigue. Pertinent negatives include no headaches, peripheral edema or shortness of breath. Risk factors for coronary artery disease include dyslipidemia and sedentary lifestyle. The current treatment provides moderate improvement. There is no history of kidney disease, CAD/MI, CVA or heart failure. Identifiable causes of hypertension include a thyroid problem.  Thyroid Problem  Presents for follow-up visit. Symptoms include fatigue. Patient reports no constipation, depressed mood or diaphoresis. The symptoms have been stable. Her past medical history is significant for hyperlipidemia. There is no history of heart failure.  Hyperlipidemia  This is a chronic problem. The current episode started more than 1 year ago. The problem is controlled. Recent lipid tests were reviewed and are normal. Pertinent negatives include no shortness of breath. Current antihyperlipidemic treatment includes statins. The current treatment provides moderate improvement of lipids. Risk factors for coronary artery disease include dyslipidemia, hypertension and a sedentary lifestyle.  Arthritis  Presents for initial visit. The disease course has been stable. She complains of pain and stiffness. Affected locations include the right MCP. Her pain is at a severity of 4/10. Associated symptoms include fatigue.      Review of Systems  Constitutional: Positive for fatigue and malaise/fatigue. Negative for diaphoresis.  Respiratory: Negative for shortness of breath.    Gastrointestinal: Negative for constipation.  Musculoskeletal: Positive for arthritis and stiffness.  Neurological: Negative for headaches.       Objective:   Physical Exam Vitals signs reviewed.  Constitutional:      General: She is not in acute distress.    Appearance: She is well-developed.  HENT:     Head: Normocephalic and atraumatic.     Right Ear: Tympanic membrane normal.     Left Ear: Tympanic membrane normal.  Eyes:     Pupils: Pupils are equal, round, and reactive to light.  Neck:     Musculoskeletal: Normal range of motion and neck supple.     Thyroid: No thyromegaly.  Cardiovascular:     Rate and Rhythm: Normal rate and regular rhythm.     Heart sounds: Normal heart sounds. No murmur.  Pulmonary:     Effort: Pulmonary effort is normal. No respiratory distress.     Breath sounds: Normal breath sounds. No wheezing.  Abdominal:     General: Bowel sounds are normal. There is no distension.     Palpations: Abdomen is soft.     Tenderness: There is no abdominal tenderness.  Musculoskeletal: Normal range of motion.        General: Swelling (right base joint of thumb, mild swelling) present. No tenderness.  Skin:    General: Skin is warm and dry.  Neurological:     Mental Status: She is alert and oriented to person, place, and time.     Cranial Nerves: No cranial nerve deficit.     Deep Tendon Reflexes: Reflexes are normal and symmetric.  Psychiatric:        Behavior: Behavior normal.        Thought Content: Thought content normal.  Judgment: Judgment normal.      BP 128/83   Pulse 67   Temp 98.1 F (36.7 C) (Oral)   Ht 5' 2"  (1.575 m)   Wt 217 lb 9.6 oz (98.7 kg)   BMI 39.80 kg/m      Assessment & Plan:  Madeline Clark comes in today with chief complaint of Medical Management of Chronic Issues (refills and lab work)   Diagnosis and orders addressed:  1. Essential hypertension, benign - CBC with Differential/Platelet - CMP14+EGFR  2.  Hypothyroidism due to acquired atrophy of thyroid - CBC with Differential/Platelet - CMP14+EGFR - TSH  3. Hyperlipidemia with target LDL less than 100 - CBC with Differential/Platelet - CMP14+EGFR - Lipid panel  4. Obesity (BMI 30-39.9) - CBC with Differential/Platelet - CMP14+EGFR  5. Primary osteoarthritis of right hand Start naproxen BID with food as needed for pain Rest ICe - CBC with Differential/Platelet - CMP14+EGFR - naproxen (NAPROSYN) 500 MG tablet; Take 1 tablet (500 mg total) by mouth 2 (two) times daily with a meal.  Dispense: 60 tablet; Refill: 1   Labs pending Health Maintenance reviewed Diet and exercise encouraged  Follow up plan: 6 months    Evelina Dun, FNP

## 2018-11-26 NOTE — Patient Instructions (Signed)

## 2018-11-27 LAB — CBC WITH DIFFERENTIAL/PLATELET
BASOS ABS: 0.1 10*3/uL (ref 0.0–0.2)
Basos: 1 %
EOS (ABSOLUTE): 0.4 10*3/uL (ref 0.0–0.4)
EOS: 6 %
Hematocrit: 43.9 % (ref 34.0–46.6)
Hemoglobin: 14.4 g/dL (ref 11.1–15.9)
IMMATURE GRANULOCYTES: 1 %
Immature Grans (Abs): 0.1 10*3/uL (ref 0.0–0.1)
LYMPHS ABS: 1.8 10*3/uL (ref 0.7–3.1)
Lymphs: 26 %
MCH: 28.7 pg (ref 26.6–33.0)
MCHC: 32.8 g/dL (ref 31.5–35.7)
MCV: 88 fL (ref 79–97)
MONOS ABS: 0.5 10*3/uL (ref 0.1–0.9)
Monocytes: 7 %
NEUTROS PCT: 59 %
Neutrophils Absolute: 4.1 10*3/uL (ref 1.4–7.0)
PLATELETS: 264 10*3/uL (ref 150–450)
RBC: 5.02 x10E6/uL (ref 3.77–5.28)
RDW: 12.5 % (ref 11.7–15.4)
WBC: 7 10*3/uL (ref 3.4–10.8)

## 2018-11-27 LAB — CMP14+EGFR
ALT: 14 IU/L (ref 0–32)
AST: 18 IU/L (ref 0–40)
Albumin/Globulin Ratio: 1.9 (ref 1.2–2.2)
Albumin: 4.4 g/dL (ref 3.8–4.9)
Alkaline Phosphatase: 70 IU/L (ref 39–117)
BUN/Creatinine Ratio: 15 (ref 12–28)
BUN: 15 mg/dL (ref 8–27)
Bilirubin Total: 0.8 mg/dL (ref 0.0–1.2)
CALCIUM: 9.6 mg/dL (ref 8.7–10.3)
CHLORIDE: 102 mmol/L (ref 96–106)
CO2: 23 mmol/L (ref 20–29)
Creatinine, Ser: 1.02 mg/dL — ABNORMAL HIGH (ref 0.57–1.00)
GFR calc Af Amer: 69 mL/min/{1.73_m2} (ref >59)
GFR calc non Af Amer: 60 mL/min/{1.73_m2} (ref >59)
GLOBULIN, TOTAL: 2.3 g/dL (ref 1.5–4.5)
Glucose: 96 mg/dL (ref 65–99)
POTASSIUM: 3.9 mmol/L (ref 3.5–5.2)
SODIUM: 142 mmol/L (ref 134–144)
Total Protein: 6.7 g/dL (ref 6.0–8.5)

## 2018-11-27 LAB — LIPID PANEL
CHOL/HDL RATIO: 2.5 ratio (ref 0.0–4.4)
Cholesterol, Total: 125 mg/dL (ref 100–199)
HDL: 51 mg/dL (ref >39)
LDL Calculated: 56 mg/dL (ref 0–99)
TRIGLYCERIDES: 88 mg/dL (ref 0–149)
VLDL Cholesterol Cal: 18 mg/dL (ref 5–40)

## 2018-11-27 LAB — TSH: TSH: 4.16 u[IU]/mL (ref 0.450–4.500)

## 2018-11-29 ENCOUNTER — Other Ambulatory Visit: Payer: Self-pay | Admitting: Nurse Practitioner

## 2018-11-29 DIAGNOSIS — E785 Hyperlipidemia, unspecified: Secondary | ICD-10-CM

## 2018-11-29 DIAGNOSIS — I10 Essential (primary) hypertension: Secondary | ICD-10-CM

## 2018-12-03 ENCOUNTER — Other Ambulatory Visit: Payer: Self-pay | Admitting: *Deleted

## 2018-12-03 DIAGNOSIS — E034 Atrophy of thyroid (acquired): Secondary | ICD-10-CM

## 2018-12-03 MED ORDER — LEVOTHYROXINE SODIUM 100 MCG PO TABS: 100.0000 ug | ORAL_TABLET | Freq: Every day | ORAL | 3 refills | Status: DC

## 2019-01-24 ENCOUNTER — Telehealth: Payer: Self-pay | Admitting: Family

## 2019-03-15 ENCOUNTER — Other Ambulatory Visit: Payer: Self-pay | Admitting: Nurse Practitioner

## 2019-03-15 DIAGNOSIS — I10 Essential (primary) hypertension: Secondary | ICD-10-CM

## 2019-04-05 ENCOUNTER — Encounter: Payer: Self-pay | Admitting: Internal Medicine

## 2019-05-31 ENCOUNTER — Other Ambulatory Visit: Payer: Self-pay

## 2019-05-31 ENCOUNTER — Ambulatory Visit: Payer: BC Managed Care – PPO | Admitting: Nurse Practitioner

## 2019-06-03 ENCOUNTER — Ambulatory Visit: Payer: BC Managed Care – PPO | Admitting: Family

## 2019-06-03 ENCOUNTER — Other Ambulatory Visit: Payer: Self-pay | Admitting: Nurse Practitioner

## 2019-06-03 ENCOUNTER — Other Ambulatory Visit: Payer: Self-pay

## 2019-06-03 ENCOUNTER — Encounter: Payer: Self-pay | Admitting: Family

## 2019-06-03 VITALS — BP 161/93 | HR 61 | Temp 97.8°F | Ht 62.0 in | Wt 227.0 lb

## 2019-06-03 DIAGNOSIS — E785 Hyperlipidemia, unspecified: Secondary | ICD-10-CM

## 2019-06-03 DIAGNOSIS — E034 Atrophy of thyroid (acquired): Secondary | ICD-10-CM

## 2019-06-03 DIAGNOSIS — I10 Essential (primary) hypertension: Secondary | ICD-10-CM | POA: Diagnosis not present

## 2019-06-03 DIAGNOSIS — M19041 Primary osteoarthritis, right hand: Secondary | ICD-10-CM

## 2019-06-03 DIAGNOSIS — E669 Obesity, unspecified: Secondary | ICD-10-CM

## 2019-06-03 MED ORDER — LISINOPRIL 20 MG PO TABS: 20.0000 mg | ORAL_TABLET | Freq: Every day | ORAL | 3 refills | Status: DC

## 2019-06-03 NOTE — Patient Instructions (Signed)

## 2019-06-03 NOTE — Progress Notes (Signed)
Subjective:    Patient ID: Madeline Clark, female    DOB: 29-Oct-1955, 63 y.o.   MRN: [id number]  Chief Complaint  Patient presents with  . Medical Management of Chronic Issues    six month recheck   Pt presents to the office today for chronic follow up. Pt's BP is elevated today. She reports she has been under a great deal of stress with her grandchildren doing virtual schooling.  Hypertension This is a chronic problem. The current episode started more than 1 year ago. The problem has been waxing and waning since onset. The problem is uncontrolled. Associated symptoms include malaise/fatigue. Pertinent negatives include no headaches, peripheral edema or shortness of breath. Risk factors for coronary artery disease include dyslipidemia, obesity and sedentary lifestyle. The current treatment provides mild improvement. There is no history of kidney disease, CAD/MI, CVA or heart failure. Identifiable causes of hypertension include a thyroid problem.  Thyroid Problem Presents for follow-up visit. Symptoms include constipation and fatigue. Patient reports no depressed mood or diarrhea. The symptoms have been stable. Her past medical history is significant for hyperlipidemia. There is no history of heart failure.  Arthritis Presents for follow-up visit. She complains of pain and stiffness. The symptoms have been stable. Affected locations include the right MCP. Her pain is at a severity of 3/10. Associated symptoms include fatigue. Pertinent negatives include no diarrhea.  Hyperlipidemia This is a chronic problem. The current episode started more than 1 year ago. The problem is controlled. Recent lipid tests were reviewed and are normal. Exacerbating diseases include obesity. Pertinent negatives include no shortness of breath. Current antihyperlipidemic treatment includes statins. The current treatment provides moderate improvement of lipids. Risk factors for coronary artery disease include  dyslipidemia, hypertension and a sedentary lifestyle.      Review of Systems  Constitutional: Positive for fatigue and malaise/fatigue.  Respiratory: Negative for shortness of breath.   Gastrointestinal: Positive for constipation. Negative for diarrhea.  Musculoskeletal: Positive for arthritis and stiffness.  Neurological: Negative for headaches.  All other systems reviewed and are negative.      Objective:   Physical Exam Vitals signs reviewed.  Constitutional:      General: She is not in acute distress.    Appearance: She is well-developed.  HENT:     Head: Normocephalic and atraumatic.     Right Ear: Tympanic membrane normal.     Left Ear: Tympanic membrane normal.  Eyes:     Pupils: Pupils are equal, round, and reactive to light.  Neck:     Musculoskeletal: Normal range of motion and neck supple.     Thyroid: No thyromegaly.  Cardiovascular:     Rate and Rhythm: Normal rate and regular rhythm.     Heart sounds: Normal heart sounds. No murmur.  Pulmonary:     Effort: Pulmonary effort is normal. No respiratory distress.     Breath sounds: Normal breath sounds. No wheezing.  Abdominal:     General: Bowel sounds are normal. There is no distension.     Palpations: Abdomen is soft.     Tenderness: There is no abdominal tenderness.  Musculoskeletal: Normal range of motion.        General: No tenderness.  Skin:    General: Skin is warm and dry.  Neurological:     Mental Status: She is alert and oriented to person, place, and time.     Cranial Nerves: No cranial nerve deficit.     Deep Tendon Reflexes: Reflexes are normal and  symmetric.  Psychiatric:        Behavior: Behavior normal.        Thought Content: Thought content normal.        Judgment: Judgment normal.       BP (!) 167/110   Pulse 63   Temp 97.8 F (36.6 C) (Temporal)   Ht _0  (1.575 m)   Wt 227 lb (103 kg)   SpO2 98%   BMI 41.52 kg/m      Assessment & Plan:  Esra Frankowski comes in today  with chief complaint of Medical Management of Chronic Issues (six month recheck)   Diagnosis and orders addressed:  1. Essential hypertension, benign -Will add lisinopril 20 mg today -Dash diet information given -Exercise encouraged - Stress Management  -Continue current meds -RTO in 2 weeks  - lisinopril (ZESTRIL) 20 MG tablet; Take 1 tablet (20 mg total) by mouth daily.  Dispense: 90 tablet; Refill: 3 - CMP14+EGFR  2. Hypothyroidism due to acquired atrophy of thyroid - CMP14+EGFR - TSH  3. Primary osteoarthritis of right hand - CMP14+EGFR  4. Obesity (BMI 30-39.9) - CMP14+EGFR  5. Hyperlipidemia with target LDL less than 100 - CMP14+EGFR   Labs pending Health Maintenance reviewed Diet and exercise encouraged  Follow up plan: 2 weeks to recheck HTN   Evelina Dun, FNP

## 2019-06-04 ENCOUNTER — Other Ambulatory Visit: Payer: Self-pay | Admitting: *Deleted

## 2019-06-04 ENCOUNTER — Other Ambulatory Visit: Payer: Self-pay | Admitting: Family

## 2019-06-04 DIAGNOSIS — E034 Atrophy of thyroid (acquired): Secondary | ICD-10-CM

## 2019-06-04 LAB — CMP14+EGFR
ALT: 9 IU/L (ref 0–32)
AST: 14 IU/L (ref 0–40)
Albumin/Globulin Ratio: 2.1 (ref 1.2–2.2)
Albumin: 4.2 g/dL (ref 3.8–4.8)
Alkaline Phosphatase: 67 IU/L (ref 39–117)
BUN/Creatinine Ratio: 14 (ref 12–28)
BUN: 15 mg/dL (ref 8–27)
Bilirubin Total: 0.5 mg/dL (ref 0.0–1.2)
CO2: 26 mmol/L (ref 20–29)
Calcium: 9.5 mg/dL (ref 8.7–10.3)
Chloride: 106 mmol/L (ref 96–106)
Creatinine, Ser: 1.04 mg/dL — ABNORMAL HIGH (ref 0.57–1.00)
GFR calc Af Amer: 67 mL/min/{1.73_m2} (ref >59)
GFR calc non Af Amer: 58 mL/min/{1.73_m2} — ABNORMAL LOW (ref >59)
Globulin, Total: 2 g/dL (ref 1.5–4.5)
Glucose: 97 mg/dL (ref 65–99)
Potassium: 4.3 mmol/L (ref 3.5–5.2)
Sodium: 145 mmol/L — ABNORMAL HIGH (ref 134–144)
Total Protein: 6.2 g/dL (ref 6.0–8.5)

## 2019-06-04 LAB — TSH: TSH: 13.3 u[IU]/mL — ABNORMAL HIGH (ref 0.450–4.500)

## 2019-06-04 MED ORDER — LEVOTHYROXINE SODIUM 150 MCG PO TABS: 150.0000 ug | ORAL_TABLET | Freq: Every day | ORAL | 11 refills | Status: DC

## 2019-06-17 ENCOUNTER — Other Ambulatory Visit: Payer: Self-pay

## 2019-06-18 ENCOUNTER — Encounter: Payer: Self-pay | Admitting: Family

## 2019-06-18 ENCOUNTER — Ambulatory Visit: Payer: BC Managed Care – PPO | Admitting: Family

## 2019-06-18 VITALS — BP 127/79 | HR 68 | Temp 98.6°F | Ht 62.0 in | Wt 226.6 lb

## 2019-06-18 DIAGNOSIS — I10 Essential (primary) hypertension: Secondary | ICD-10-CM | POA: Diagnosis not present

## 2019-06-18 MED ORDER — HYDROCHLOROTHIAZIDE 25 MG PO TABS: 25.0000 mg | ORAL_TABLET | Freq: Every day | ORAL | 1 refills | Status: DC

## 2019-06-18 MED ORDER — LISINOPRIL 20 MG PO TABS: 20.0000 mg | ORAL_TABLET | Freq: Every day | ORAL | 3 refills | Status: DC

## 2019-06-18 NOTE — Progress Notes (Signed)
   Subjective:    Patient ID: Madeline Clark, female    DOB: [birth year], 63 y.o.   MRN: [id number]   Chief Complaint  Patient presents with  . Hypertension   Pt presents to the office today to recheck HTN. Pt's BP is at goal!! She was seen on 06/03/19 and started on Lisinopril 20 mg.  Hypertension This is a chronic problem. The current episode started more than 1 year ago. The problem has been resolved since onset. The problem is controlled. Pertinent negatives include no headaches, malaise/fatigue, peripheral edema or shortness of breath. Past treatments include ACE inhibitors. The current treatment provides moderate improvement. There is no history of kidney disease, CAD/MI or CVA.      Review of Systems  Constitutional: Negative for malaise/fatigue.  Respiratory: Negative for shortness of breath.   Neurological: Negative for headaches.  All other systems reviewed and are negative.      Objective:   Physical Exam Vitals signs reviewed.  Constitutional:      General: She is not in acute distress.    Appearance: She is well-developed.  HENT:     Head: Normocephalic and atraumatic.     Right Ear: Tympanic membrane normal.     Left Ear: Tympanic membrane normal.  Eyes:     Pupils: Pupils are equal, round, and reactive to light.  Neck:     Musculoskeletal: Normal range of motion and neck supple.     Thyroid: No thyromegaly.  Cardiovascular:     Rate and Rhythm: Normal rate and regular rhythm.     Heart sounds: Normal heart sounds. No murmur.  Pulmonary:     Effort: Pulmonary effort is normal. No respiratory distress.     Breath sounds: Normal breath sounds. No wheezing.  Abdominal:     General: Bowel sounds are normal. There is no distension.     Palpations: Abdomen is soft.     Tenderness: There is no abdominal tenderness.  Musculoskeletal: Normal range of motion.        General: No tenderness.  Skin:    General: Skin is warm and dry.  Neurological:     Mental  Status: She is alert and oriented to person, place, and time.     Cranial Nerves: No cranial nerve deficit.     Deep Tendon Reflexes: Reflexes are normal and symmetric.  Psychiatric:        Behavior: Behavior normal.        Thought Content: Thought content normal.        Judgment: Judgment normal.       BP 127/79   Pulse 68   Temp 98.6 F (37 C) (Temporal)   Ht '5\' 2"'$  (1.575 m)   Wt 226 lb 9.6 oz (102.8 kg)   SpO2 98%   BMI 41.45 kg/m      Assessment & Plan:  Madeline Clark comes in today with chief complaint of Hypertension   Diagnosis and orders addressed:  1. Essential hypertension, benign Continue medication  -Dash diet information given -Exercise encouraged - Stress Management  -RTO in 6 months  - BMP8+EGFR   Evelina Dun, FNP

## 2019-06-18 NOTE — Patient Instructions (Signed)

## 2019-06-19 LAB — BMP8+EGFR
BUN/Creatinine Ratio: 14 (ref 12–28)
BUN: 15 mg/dL (ref 8–27)
CO2: 25 mmol/L (ref 20–29)
Calcium: 9.7 mg/dL (ref 8.7–10.3)
Chloride: 107 mmol/L — ABNORMAL HIGH (ref 96–106)
Creatinine, Ser: 1.04 mg/dL — ABNORMAL HIGH (ref 0.57–1.00)
GFR calc Af Amer: 67 mL/min/{1.73_m2} (ref >59)
GFR calc non Af Amer: 58 mL/min/{1.73_m2} — ABNORMAL LOW (ref >59)
Glucose: 101 mg/dL — ABNORMAL HIGH (ref 65–99)
Potassium: 5.5 mmol/L — ABNORMAL HIGH (ref 3.5–5.2)
Sodium: 146 mmol/L — ABNORMAL HIGH (ref 134–144)

## 2019-06-20 ENCOUNTER — Other Ambulatory Visit: Payer: Self-pay | Admitting: Family

## 2019-06-20 DIAGNOSIS — E875 Hyperkalemia: Secondary | ICD-10-CM

## 2019-06-28 ENCOUNTER — Other Ambulatory Visit: Payer: Self-pay

## 2019-06-28 ENCOUNTER — Other Ambulatory Visit: Payer: BC Managed Care – PPO

## 2019-06-28 DIAGNOSIS — E875 Hyperkalemia: Secondary | ICD-10-CM

## 2019-06-29 LAB — BMP8+EGFR
BUN/Creatinine Ratio: 18 (ref 12–28)
BUN: 18 mg/dL (ref 8–27)
CO2: 27 mmol/L (ref 20–29)
Calcium: 9.3 mg/dL (ref 8.7–10.3)
Chloride: 101 mmol/L (ref 96–106)
Creatinine, Ser: 0.99 mg/dL (ref 0.57–1.00)
GFR calc Af Amer: 71 mL/min/{1.73_m2} (ref >59)
GFR calc non Af Amer: 62 mL/min/{1.73_m2} (ref >59)
Glucose: 93 mg/dL (ref 65–99)
Potassium: 3.8 mmol/L (ref 3.5–5.2)
Sodium: 141 mmol/L (ref 134–144)

## 2019-07-03 ENCOUNTER — Telehealth: Payer: Self-pay | Admitting: Family

## 2019-07-03 NOTE — Telephone Encounter (Signed)
Patient aware and verbalized understanding. °

## 2019-08-12 ENCOUNTER — Other Ambulatory Visit: Payer: Self-pay

## 2019-08-12 ENCOUNTER — Other Ambulatory Visit: Payer: BC Managed Care – PPO

## 2019-08-12 DIAGNOSIS — E034 Atrophy of thyroid (acquired): Secondary | ICD-10-CM

## 2019-08-13 ENCOUNTER — Other Ambulatory Visit: Payer: Self-pay | Admitting: Family

## 2019-08-13 LAB — TSH: TSH: 0.073 u[IU]/mL — ABNORMAL LOW (ref 0.450–4.500)

## 2019-08-13 MED ORDER — LEVOTHYROXINE SODIUM 137 MCG PO TABS: 137.0000 ug | ORAL_TABLET | Freq: Every day | ORAL | 1 refills | Status: DC

## 2019-12-07 ENCOUNTER — Other Ambulatory Visit: Payer: Self-pay | Admitting: Family

## 2020-01-01 ENCOUNTER — Other Ambulatory Visit: Payer: Self-pay | Admitting: Family

## 2020-01-01 DIAGNOSIS — E785 Hyperlipidemia, unspecified: Secondary | ICD-10-CM

## 2020-03-12 ENCOUNTER — Other Ambulatory Visit: Payer: Self-pay

## 2020-03-12 ENCOUNTER — Encounter: Payer: Self-pay | Admitting: Family

## 2020-03-12 ENCOUNTER — Ambulatory Visit: Payer: BC Managed Care – PPO | Admitting: Family

## 2020-03-12 VITALS — BP 123/76 | HR 89 | Temp 98.1°F | Ht 62.0 in | Wt 226.4 lb

## 2020-03-12 DIAGNOSIS — M19041 Primary osteoarthritis, right hand: Secondary | ICD-10-CM

## 2020-03-12 DIAGNOSIS — J069 Acute upper respiratory infection, unspecified: Secondary | ICD-10-CM

## 2020-03-12 DIAGNOSIS — E785 Hyperlipidemia, unspecified: Secondary | ICD-10-CM | POA: Diagnosis not present

## 2020-03-12 DIAGNOSIS — E034 Atrophy of thyroid (acquired): Secondary | ICD-10-CM

## 2020-03-12 DIAGNOSIS — I10 Essential (primary) hypertension: Secondary | ICD-10-CM | POA: Diagnosis not present

## 2020-03-12 DIAGNOSIS — Z114 Encounter for screening for human immunodeficiency virus [HIV]: Secondary | ICD-10-CM

## 2020-03-12 DIAGNOSIS — E669 Obesity, unspecified: Secondary | ICD-10-CM

## 2020-03-12 DIAGNOSIS — Z5320 Procedure and treatment not carried out because of patient's decision for unspecified reasons: Secondary | ICD-10-CM

## 2020-03-12 MED ORDER — HYDROCHLOROTHIAZIDE 25 MG PO TABS: 25.0000 mg | ORAL_TABLET | Freq: Every day | ORAL | 3 refills | Status: DC

## 2020-03-12 MED ORDER — FLUTICASONE PROPIONATE 50 MCG/ACT NA SUSP: 2.0000 | Freq: Every day | NASAL | 6 refills | Status: DC

## 2020-03-12 MED ORDER — SIMVASTATIN 40 MG PO TABS: 40.0000 mg | ORAL_TABLET | Freq: Every day | ORAL | 3 refills | Status: DC

## 2020-03-12 MED ORDER — LEVOTHYROXINE SODIUM 137 MCG PO TABS: 137.0000 ug | ORAL_TABLET | Freq: Every day | ORAL | 3 refills | Status: DC

## 2020-03-12 MED ORDER — LISINOPRIL 20 MG PO TABS: 20.0000 mg | ORAL_TABLET | Freq: Every day | ORAL | 3 refills | Status: DC

## 2020-03-12 NOTE — Progress Notes (Signed)
Subjective:    Patient ID: Madeline Clark, female    DOB: 1956/01/22, 64 y.o.   MRN: [id number]  Chief Complaint  Patient presents with  . Hypertension   Pt presents to the office today for chronic follow up. Hypertension This is a chronic problem. The current episode started more than 1 year ago. The problem has been resolved since onset. The problem is controlled. Associated symptoms include malaise/fatigue. Pertinent negatives include no headaches, peripheral edema or shortness of breath. Risk factors for coronary artery disease include dyslipidemia, diabetes mellitus, obesity and sedentary lifestyle. Identifiable causes of hypertension include a thyroid problem.  Hyperlipidemia Pertinent negatives include no shortness of breath. Current antihyperlipidemic treatment includes statins. The current treatment provides moderate improvement of lipids. Risk factors for coronary artery disease include dyslipidemia, hypertension, a sedentary lifestyle and post-menopausal.  Thyroid Problem Presents for follow-up visit. Patient reports no anxiety, depressed mood, diaphoresis or dry skin. The symptoms have been stable. Her past medical history is significant for hyperlipidemia.  Arthritis Presents for follow-up visit. She complains of pain and stiffness. The symptoms have been stable. Affected locations include the right MCP. Her pain is at a severity of 6/10.  Sinusitis This is a new problem. The current episode started yesterday. The problem is unchanged. There has been no fever. The pain is mild. Associated symptoms include congestion, coughing and a sore throat. Pertinent negatives include no diaphoresis, ear pain, headaches, shortness of breath, sinus pressure or sneezing. Past treatments include oral decongestants. The treatment provided mild relief.      Review of Systems  Constitutional: Positive for malaise/fatigue. Negative for diaphoresis.  HENT: Positive for congestion and sore throat.  Negative for ear pain, sinus pressure and sneezing.   Respiratory: Positive for cough. Negative for shortness of breath.   Musculoskeletal: Positive for arthritis and stiffness.  Neurological: Negative for headaches.  Psychiatric/Behavioral: The patient is not nervous/anxious.   All other systems reviewed and are negative.      Objective:   Physical Exam Vitals reviewed.  Constitutional:      General: She is not in acute distress.    Appearance: She is well-developed.  HENT:     Head: Normocephalic and atraumatic.     Right Ear: Tympanic membrane normal.     Left Ear: Tympanic membrane normal.  Eyes:     Pupils: Pupils are equal, round, and reactive to light.  Neck:     Thyroid: No thyromegaly.  Cardiovascular:     Rate and Rhythm: Normal rate and regular rhythm.     Heart sounds: Normal heart sounds. No murmur heard.   Pulmonary:     Effort: Pulmonary effort is normal. No respiratory distress.     Breath sounds: Normal breath sounds. No wheezing.  Abdominal:     General: Bowel sounds are normal. There is no distension.     Palpations: Abdomen is soft.     Tenderness: There is no abdominal tenderness.  Musculoskeletal:        General: No tenderness. Normal range of motion.     Cervical back: Normal range of motion and neck supple.  Skin:    General: Skin is warm and dry.  Neurological:     Mental Status: She is alert and oriented to person, place, and time.     Cranial Nerves: No cranial nerve deficit.     Deep Tendon Reflexes: Reflexes are normal and symmetric.  Psychiatric:        Behavior: Behavior normal.  Thought Content: Thought content normal.        Judgment: Judgment normal.       BP 123/76   Pulse 89   Temp 98.1 F (36.7 C) (Temporal)   Ht 5' 2"  (1.575 m)   Wt 226 lb 6.4 oz (102.7 kg)   SpO2 93%   BMI 41.41 kg/m      Assessment & Plan:  Madeline Clark comes in today with chief complaint of Hypertension   Diagnosis and orders  addressed:  1. Essential hypertension, benign - hydrochlorothiazide (HYDRODIURIL) 25 MG tablet; Take 1 tablet (25 mg total) by mouth daily.  Dispense: 90 tablet; Refill: 3 - lisinopril (ZESTRIL) 20 MG tablet; Take 1 tablet (20 mg total) by mouth daily.  Dispense: 90 tablet; Refill: 3 - CMP14+EGFR - CBC with Differential/Platelet  2. Hyperlipidemia with target LDL less than 100 - simvastatin (ZOCOR) 40 MG tablet; Take 1 tablet (40 mg total) by mouth at bedtime. (Needs to be seen before next refill)  Dispense: 90 tablet; Refill: 3 - CMP14+EGFR - CBC with Differential/Platelet - Lipid panel  3. Hypothyroidism due to acquired atrophy of thyroid - CMP14+EGFR - CBC with Differential/Platelet - TSH  4. Primary osteoarthritis of right hand - CMP14+EGFR - CBC with Differential/Platelet  5. Obesity (BMI 30-39.9) - CMP14+EGFR - CBC with Differential/Platelet  6. HIV screening declined  7. Encounter for screening for HIV - HIV Antibody (routine testing w rflx)  8. Viral URI - Take meds as prescribed - Use a cool mist humidifier  -Use saline nose sprays frequently -Force fluids -For any cough or congestion  Use plain Mucinex- regular strength or max strength is fine -For fever or aces or pains- take tylenol or ibuprofen. -Throat lozenges if help - fluticasone (FLONASE) 50 MCG/ACT nasal spray; Place 2 sprays into both nostrils daily.  Dispense: 16 g; Refill: 6   Labs pending Health Maintenance reviewed Diet and exercise encouraged  Follow up plan: 8 months    Evelina Dun, FNP

## 2020-03-12 NOTE — Patient Instructions (Signed)
Upper Respiratory Infection, Adult An upper respiratory infection (URI) is a common viral infection of the nose, throat, and upper air passages that lead to the lungs. The most common type of URI is the common cold. URIs usually get better on their own, without medical treatment. What are the causes? A URI is caused by a virus. You may catch a virus by:  Breathing in droplets from an infected person's cough or sneeze.  Touching something that has been exposed to the virus (contaminated) and then touching your mouth, nose, or eyes. What increases the risk? You are more likely to get a URI if:  You are very young or very old.  It is autumn or winter.  You have close contact with others, such as at a daycare, school, or health care facility.  You smoke.  You have long-term (chronic) heart or lung disease.  You have a weakened disease-fighting (immune) system.  You have nasal allergies or asthma.  You are experiencing a lot of stress.  You work in an area that has poor air circulation.  You have poor nutrition. What are the signs or symptoms? A URI usually involves some of the following symptoms:  Runny or stuffy (congested) nose.  Sneezing.  Cough.  Sore throat.  Headache.  Fatigue.  Fever.  Loss of appetite.  Pain in your forehead, behind your eyes, and over your cheekbones (sinus pain).  Muscle aches.  Redness or irritation of the eyes.  Pressure in the ears or face. How is this diagnosed? This condition may be diagnosed based on your medical history and symptoms, and a physical exam. Your health care provider may use a cotton swab to take a mucus sample from your nose (nasal swab). This sample can be tested to determine what virus is causing the illness. How is this treated? URIs usually get better on their own within 7-10 days. You can take steps at home to relieve your symptoms. Medicines cannot cure URIs, but your health care provider may recommend  certain medicines to help relieve symptoms, such as:  Over-the-counter cold medicines.  Cough suppressants. Coughing is a type of defense against infection that helps to clear the respiratory system, so take these medicines only as recommended by your health care provider.  Fever-reducing medicines. Follow these instructions at home: Activity  Rest as needed.  If you have a fever, stay home from work or school until your fever is gone or until your health care provider says you are no longer contagious. Your health care provider may have you wear a face mask to prevent your infection from spreading. Relieving symptoms  Gargle with a salt-water mixture 3-4 times a day or as needed. To make a salt-water mixture, completely dissolve -1 tsp of salt in 1 cup of warm water.  Use a cool-mist humidifier to add moisture to the air. This can help you breathe more easily. Eating and drinking   Drink enough fluid to keep your urine pale yellow.  Eat soups and other clear broths. General instructions   Take over-the-counter and prescription medicines only as told by your health care provider. These include cold medicines, fever reducers, and cough suppressants.  Do not use any products that contain nicotine or tobacco, such as cigarettes and e-cigarettes. If you need help quitting, ask your health care provider.  Stay away from secondhand smoke.  Stay up to date on all immunizations, including the yearly (annual) flu vaccine.  Keep all follow-up visits as told by your health   care provider. This is important. How to prevent the spread of infection to others   URIs can be passed from person to person (are contagious). To prevent the infection from spreading: ? Wash your hands often with soap and water. If soap and water are not available, use hand sanitizer. ? Avoid touching your mouth, face, eyes, or nose. ? Cough or sneeze into a tissue or your sleeve or elbow instead of into your hand  or into the air. Contact a health care provider if:  You are getting worse instead of better.  You have a fever or chills.  Your mucus is brown or red.  You have yellow or brown discharge coming from your nose.  You have pain in your face, especially when you bend forward.  You have swollen neck glands.  You have pain while swallowing.  You have white areas in the back of your throat. Get help right away if:  You have shortness of breath that gets worse.  You have severe or persistent: ? Headache. ? Ear pain. ? Sinus pain. ? Chest pain.  You have chronic lung disease along with any of the following: ? Wheezing. ? Prolonged cough. ? Coughing up blood. ? A change in your usual mucus.  You have a stiff neck.  You have changes in your: ? Vision. ? Hearing. ? Thinking. ? Mood. Summary  An upper respiratory infection (URI) is a common infection of the nose, throat, and upper air passages that lead to the lungs.  A URI is caused by a virus.  URIs usually get better on their own within 7-10 days.  Medicines cannot cure URIs, but your health care provider may recommend certain medicines to help relieve symptoms. This information is not intended to replace advice given to you by your health care provider. Make sure you discuss any questions you have with your health care provider. Document Revised: 08/30/2018 Document Reviewed: 04/07/2017 Elsevier Patient Education  2020 [employer]    

## 2020-03-13 ENCOUNTER — Other Ambulatory Visit: Payer: Self-pay | Admitting: Family

## 2020-03-13 LAB — CBC WITH DIFFERENTIAL/PLATELET
Basophils Absolute: 0.1 10*3/uL (ref 0.0–0.2)
Basos: 1 %
EOS (ABSOLUTE): 0.5 10*3/uL — ABNORMAL HIGH (ref 0.0–0.4)
Eos: 5 %
Hematocrit: 42.4 % (ref 34.0–46.6)
Hemoglobin: 14.1 g/dL (ref 11.1–15.9)
Immature Grans (Abs): 0.1 10*3/uL (ref 0.0–0.1)
Immature Granulocytes: 1 %
Lymphocytes Absolute: 1.6 10*3/uL (ref 0.7–3.1)
Lymphs: 16 %
MCH: 29.4 pg (ref 26.6–33.0)
MCHC: 33.3 g/dL (ref 31.5–35.7)
MCV: 88 fL (ref 79–97)
Monocytes Absolute: 0.7 10*3/uL (ref 0.1–0.9)
Monocytes: 7 %
Neutrophils Absolute: 7.1 10*3/uL — ABNORMAL HIGH (ref 1.4–7.0)
Neutrophils: 70 %
Platelets: 268 10*3/uL (ref 150–450)
RBC: 4.8 x10E6/uL (ref 3.77–5.28)
RDW: 11.7 % (ref 11.7–15.4)
WBC: 10.2 10*3/uL (ref 3.4–10.8)

## 2020-03-13 LAB — CMP14+EGFR
ALT: 16 IU/L (ref 0–32)
AST: 16 IU/L (ref 0–40)
Albumin/Globulin Ratio: 1.8 (ref 1.2–2.2)
Albumin: 4.2 g/dL (ref 3.8–4.8)
Alkaline Phosphatase: 81 IU/L (ref 48–121)
BUN/Creatinine Ratio: 13 (ref 12–28)
BUN: 13 mg/dL (ref 8–27)
Bilirubin Total: 0.5 mg/dL (ref 0.0–1.2)
CO2: 25 mmol/L (ref 20–29)
Calcium: 9.3 mg/dL (ref 8.7–10.3)
Chloride: 104 mmol/L (ref 96–106)
Creatinine, Ser: 0.99 mg/dL (ref 0.57–1.00)
GFR calc Af Amer: 71 mL/min/{1.73_m2} (ref >59)
GFR calc non Af Amer: 62 mL/min/{1.73_m2} (ref >59)
Globulin, Total: 2.4 g/dL (ref 1.5–4.5)
Glucose: 111 mg/dL — ABNORMAL HIGH (ref 65–99)
Potassium: 3.8 mmol/L (ref 3.5–5.2)
Sodium: 142 mmol/L (ref 134–144)
Total Protein: 6.6 g/dL (ref 6.0–8.5)

## 2020-03-13 LAB — TSH: TSH: 0.054 u[IU]/mL — ABNORMAL LOW (ref 0.450–4.500)

## 2020-03-13 LAB — LIPID PANEL
Chol/HDL Ratio: 2.6 ratio (ref 0.0–4.4)
Cholesterol, Total: 120 mg/dL (ref 100–199)
HDL: 46 mg/dL (ref >39)
LDL Chol Calc (NIH): 56 mg/dL (ref 0–99)
Triglycerides: 96 mg/dL (ref 0–149)
VLDL Cholesterol Cal: 18 mg/dL (ref 5–40)

## 2020-03-13 LAB — HIV ANTIBODY (ROUTINE TESTING W REFLEX): HIV Screen 4th Generation wRfx: NONREACTIVE

## 2020-03-13 MED ORDER — LEVOTHYROXINE SODIUM 125 MCG PO TABS
125.0000 ug | ORAL_TABLET | Freq: Every day | ORAL | 2 refills | Status: DC
Start: 2020-03-13 — End: 2020-05-28

## 2020-05-27 ENCOUNTER — Encounter: Payer: Self-pay | Admitting: Family

## 2020-05-27 ENCOUNTER — Ambulatory Visit: Payer: BC Managed Care – PPO | Admitting: Family

## 2020-05-27 ENCOUNTER — Other Ambulatory Visit: Payer: Self-pay

## 2020-05-27 VITALS — BP 130/88 | HR 93 | Temp 97.4°F | Ht 62.0 in | Wt 221.8 lb

## 2020-05-27 DIAGNOSIS — E034 Atrophy of thyroid (acquired): Secondary | ICD-10-CM

## 2020-05-27 DIAGNOSIS — Z23 Encounter for immunization: Secondary | ICD-10-CM

## 2020-05-27 NOTE — Progress Notes (Signed)
   [facility]  Name:  Madeline Clark    MRN: [id number] DOB: Sep 23, 1955  05/27/2020  Ms. Madeline Clark was observed post Covid-19 immunization for 15 minutes without incident. She was provided with Vaccine Information Sheet and instruction to access the V-Safe system.   Ms. Madeline Clark was instructed to call 911 with any severe reactions post vaccine: Marland Kitchen Difficulty breathing  . Swelling of face and throat  . A fast heartbeat  . A bad rash all over body  . Dizziness and weakness

## 2020-05-27 NOTE — Progress Notes (Signed)
° °  Subjective:  ° ° Patient ID: Madeline Clark, female    DOB: 05/27/1956, 64 y.o.   MRN: [id number] ° °Chief Complaint  °Patient presents with  °• Hypothyroidism  °  Thyroid levels abnormal- Levothyroxine decreased to 125 mcg from 137 mcg, needs appt to recheck in 2 months   ° °Pt presents to the office today to recheck thyroid. She was seen in the office two months ago and we decreased her levothyroxine to 125 mg from 137 mcg.  °Thyroid Problem °Presents for follow-up visit. Patient reports no anxiety, constipation, fatigue or hoarse voice. The symptoms have been stable.  ° ° ° ° °Review of Systems  °Constitutional: Negative for fatigue.  °HENT: Negative for hoarse voice.   °Gastrointestinal: Negative for constipation.  °Psychiatric/Behavioral: The patient is not nervous/anxious.   °All other systems reviewed and are negative. ° ° °   °Objective:  ° Physical Exam °Vitals reviewed.  °Constitutional:   °   General: She is not in acute distress. °   Appearance: She is well-developed. She is obese.  °HENT:  °   Head: Normocephalic and atraumatic.  °   Right Ear: Tympanic membrane normal.  °   Left Ear: Tympanic membrane normal.  °Eyes:  °   Pupils: Pupils are equal, round, and reactive to light.  °Neck:  °   Thyroid: No thyromegaly.  °Cardiovascular:  °   Rate and Rhythm: Normal rate and regular rhythm.  °   Heart sounds: Normal heart sounds. No murmur heard.  ° °Pulmonary:  °   Effort: Pulmonary effort is normal. No respiratory distress.  °   Breath sounds: Normal breath sounds. No wheezing.  °Abdominal:  °   General: Bowel sounds are normal. There is no distension.  °   Palpations: Abdomen is soft.  °   Tenderness: There is no abdominal tenderness.  °Musculoskeletal:     °   General: No tenderness. Normal range of motion.  °   Cervical back: Normal range of motion and neck supple.  °Skin: °   General: Skin is warm and dry.  °Neurological:  °   Mental Status: She is alert and oriented to person, place, and time.  °    Cranial Nerves: No cranial nerve deficit.  °   Deep Tendon Reflexes: Reflexes are normal and symmetric.  °Psychiatric:     °   Behavior: Behavior normal.     °   Thought Content: Thought content normal.     °   Judgment: Judgment normal.  ° ° ° ° ° ° °  BP 130/88    Pulse 93    Temp (!) 97.4 °F (36.3 °C) (Temporal)    Ht 5' 2" (1.575 m)    Wt 221 lb 12.8 oz (100.6 kg)    SpO2 94%    BMI 40.57 kg/m²  ° °Assessment & Plan:  °Jennessa Stinnette comes in today with chief complaint of Hypothyroidism (Thyroid levels abnormal- Levothyroxine decreased to 125 mcg from 137 mcg, needs appt to recheck in 2 months ) ° ° °Diagnosis and orders addressed: ° °1. Encounter for immunization °- Pfizer SARS-COV-2 Vaccine °- BMP8+EGFR °- TSH ° °2. Hypothyroidism due to acquired atrophy of thyroid °- BMP8+EGFR °- TSH ° ° ° °Christy Hawks, FNP ° °

## 2020-05-27 NOTE — Patient Instructions (Signed)
Hypothyroidism  Hypothyroidism is when the thyroid gland does not make enough of certain hormones (it is underactive). The thyroid gland is a small gland located in the lower front part of the neck, just in front of the windpipe (trachea). This gland makes hormones that help control how the body uses food for energy (metabolism) as well as how the heart and brain function. These hormones also play a role in keeping your bones strong. When the thyroid is underactive, it produces too little of the hormones thyroxine (T4) and triiodothyronine (T3). What are the causes? This condition may be caused by:  Hashimoto's disease. This is a disease in which the body's disease-fighting system (immune system) attacks the thyroid gland. This is the most common cause.  Viral infections.  Pregnancy.  Certain medicines.  Birth defects.  Past radiation treatments to the head or neck for cancer.  Past treatment with radioactive iodine.  Past exposure to radiation in the environment.  Past surgical removal of part or all of the thyroid.  Problems with a gland in the center of the brain (pituitary gland).  Lack of enough iodine in the diet. What increases the risk? You are more likely to develop this condition if:  You are female.  You have a family history of thyroid conditions.  You use a medicine called lithium.  You take medicines that affect the immune system (immunosuppressants). What are the signs or symptoms? Symptoms of this condition include:  Feeling as though you have no energy (lethargy).  Not being able to tolerate cold.  Weight gain that is not explained by a change in diet or exercise habits.  Lack of appetite.  Dry skin.  Coarse hair.  Menstrual irregularity.  Slowing of thought processes.  Constipation.  Sadness or depression. How is this diagnosed? This condition may be diagnosed based on:  Your symptoms, your medical history, and a physical exam.  Blood  tests. You may also have imaging tests, such as an ultrasound or MRI. How is this treated? This condition is treated with medicine that replaces the thyroid hormones that your body does not make. After you begin treatment, it may take several weeks for symptoms to go away. Follow these instructions at home:  Take over-the-counter and prescription medicines only as told by your health care provider.  If you start taking any new medicines, tell your health care provider.  Keep all follow-up visits as told by your health care provider. This is important. ? As your condition improves, your dosage of thyroid hormone medicine may change. ? You will need to have blood tests regularly so that your health care provider can monitor your condition. Contact a health care provider if:  Your symptoms do not get better with treatment.  You are taking thyroid replacement medicine and you: ? Sweat a lot. ? Have tremors. ? Feel anxious. ? Lose weight rapidly. ? Cannot tolerate heat. ? Have emotional swings. ? Have diarrhea. ? Feel weak. Get help right away if you have:  Chest pain.  An irregular heartbeat.  A rapid heartbeat.  Difficulty breathing. Summary  Hypothyroidism is when the thyroid gland does not make enough of certain hormones (it is underactive).  When the thyroid is underactive, it produces too little of the hormones thyroxine (T4) and triiodothyronine (T3).  The most common cause is Hashimoto's disease, a disease in which the body's disease-fighting system (immune system) attacks the thyroid gland. The condition can also be caused by viral infections, medicine, pregnancy, or past   radiation treatment to the head or neck.  Symptoms may include weight gain, dry skin, constipation, feeling as though you do not have energy, and not being able to tolerate cold.  This condition is treated with medicine to replace the thyroid hormones that your body does not make. This information  is not intended to replace advice given to you by your health care provider. Make sure you discuss any questions you have with your health care provider. Document Revised: 08/04/2017 Document Reviewed: 08/02/2017 Elsevier Patient Education  2020 Elsevier Inc.  

## 2020-05-28 ENCOUNTER — Telehealth: Payer: Self-pay | Admitting: Family

## 2020-05-28 ENCOUNTER — Other Ambulatory Visit: Payer: Self-pay | Admitting: Family

## 2020-05-28 LAB — TSH: TSH: 0.214 u[IU]/mL — ABNORMAL LOW (ref 0.450–4.500)

## 2020-05-28 LAB — BMP8+EGFR
BUN/Creatinine Ratio: 15 (ref 12–28)
BUN: 16 mg/dL (ref 8–27)
CO2: 26 mmol/L (ref 20–29)
Calcium: 9.8 mg/dL (ref 8.7–10.3)
Chloride: 103 mmol/L (ref 96–106)
Creatinine, Ser: 1.06 mg/dL — ABNORMAL HIGH (ref 0.57–1.00)
GFR calc Af Amer: 65 mL/min/{1.73_m2} (ref >59)
GFR calc non Af Amer: 56 mL/min/{1.73_m2} — ABNORMAL LOW (ref >59)
Glucose: 107 mg/dL — ABNORMAL HIGH (ref 65–99)
Potassium: 4.8 mmol/L (ref 3.5–5.2)
Sodium: 143 mmol/L (ref 134–144)

## 2020-05-28 MED ORDER — LEVOTHYROXINE SODIUM 100 MCG PO TABS: 100.0000 ug | ORAL_TABLET | Freq: Every day | ORAL | 11 refills | Status: DC

## 2020-05-28 NOTE — Telephone Encounter (Signed)
Patient aware and verbalized understanding. °

## 2020-06-17 ENCOUNTER — Other Ambulatory Visit: Payer: Self-pay

## 2020-06-17 ENCOUNTER — Ambulatory Visit (INDEPENDENT_AMBULATORY_CARE_PROVIDER_SITE_OTHER): Payer: BC Managed Care – PPO

## 2020-06-17 DIAGNOSIS — Z23 Encounter for immunization: Secondary | ICD-10-CM

## 2020-06-17 NOTE — Progress Notes (Signed)
   Covid-19 Vaccination [facility]  Name:  Madeline Clark    MRN: [id number] DOB: 08/05/1956  06/17/2020  Madeline Clark was observed post Covid-19 immunization for 15 minutes without incident. She was provided with Vaccine Information Sheet and instruction to access the V-Safe system.   Madeline Clark was instructed to call 911 with any severe reactions post vaccine: Marland Kitchen Difficulty breathing  . Swelling of face and throat  . A fast heartbeat  . A bad rash all over body  . Dizziness and weakness   Immunizations Administered    Name Date Dose VIS Date Route   Pfizer COVID-19 Vaccine 06/17/2020  9:25 AM 0.3 mL 10/30/2018 Intramuscular   Manufacturer: [employer]   Lot: [id number]   NDC: [address]-5

## 2020-06-26 ENCOUNTER — Other Ambulatory Visit: Payer: Self-pay | Admitting: Family Medicine

## 2020-06-26 DIAGNOSIS — E034 Atrophy of thyroid (acquired): Secondary | ICD-10-CM

## 2020-11-10 ENCOUNTER — Ambulatory Visit: Payer: BC Managed Care – PPO | Admitting: Family

## 2020-11-10 ENCOUNTER — Other Ambulatory Visit: Payer: Self-pay

## 2020-11-10 ENCOUNTER — Encounter: Payer: Self-pay | Admitting: Family

## 2020-11-10 VITALS — BP 119/65 | HR 60 | Temp 97.3°F | Ht 62.0 in | Wt 222.6 lb

## 2020-11-10 DIAGNOSIS — I10 Essential (primary) hypertension: Secondary | ICD-10-CM | POA: Diagnosis not present

## 2020-11-10 DIAGNOSIS — J301 Allergic rhinitis due to pollen: Secondary | ICD-10-CM

## 2020-11-10 DIAGNOSIS — E669 Obesity, unspecified: Secondary | ICD-10-CM

## 2020-11-10 DIAGNOSIS — M19041 Primary osteoarthritis, right hand: Secondary | ICD-10-CM

## 2020-11-10 DIAGNOSIS — E785 Hyperlipidemia, unspecified: Secondary | ICD-10-CM | POA: Diagnosis not present

## 2020-11-10 DIAGNOSIS — E034 Atrophy of thyroid (acquired): Secondary | ICD-10-CM | POA: Diagnosis not present

## 2020-11-10 MED ORDER — FLUTICASONE PROPIONATE 50 MCG/ACT NA SUSP: 2.0000 | Freq: Every day | NASAL | 6 refills | Status: DC

## 2020-11-10 MED ORDER — HYDROCHLOROTHIAZIDE 25 MG PO TABS: 25.0000 mg | ORAL_TABLET | Freq: Every day | ORAL | 3 refills | Status: DC

## 2020-11-10 MED ORDER — SIMVASTATIN 40 MG PO TABS: 40.0000 mg | ORAL_TABLET | Freq: Every day | ORAL | 3 refills | Status: DC

## 2020-11-10 MED ORDER — CETIRIZINE HCL 10 MG PO TABS: 10.0000 mg | ORAL_TABLET | Freq: Every day | ORAL | 4 refills | Status: DC

## 2020-11-10 MED ORDER — LISINOPRIL 20 MG PO TABS: 20.0000 mg | ORAL_TABLET | Freq: Every day | ORAL | 3 refills | Status: DC

## 2020-11-10 MED ORDER — DICLOFENAC SODIUM 75 MG PO TBEC: 75.0000 mg | DELAYED_RELEASE_TABLET | Freq: Two times a day (BID) | ORAL | 2 refills | Status: DC

## 2020-11-10 NOTE — Patient Instructions (Signed)
Osteoarthritis  Osteoarthritis is a type of arthritis. It refers to joint pain or joint disease. Osteoarthritis affects tissue that covers the ends of bones in joints (cartilage). Cartilage acts as a cushion between the bones and helps them move smoothly. Osteoarthritis occurs when cartilage in the joints gets worn down. Osteoarthritis is sometimes called "wear and tear" arthritis. Osteoarthritis is the most common form of arthritis. It often occurs in older people. It is a condition that gets worse over time. The joints most often affected by this condition are in the fingers, toes, hips, knees, and spine, including the neck and lower back. What are the causes? This condition is caused by the wearing down of cartilage that covers the ends of bones. What increases the risk? The following factors may make you more likely to develop this condition:  Being age 50 or older.  Obesity.  Overuse of joints.  Past injury of a joint.  Past surgery on a joint.  Family history of osteoarthritis. What are the signs or symptoms? The main symptoms of this condition are pain, swelling, and stiffness in the joint. Other symptoms may include:  An enlarged joint.  More pain and further damage caused by small pieces of bone or cartilage that break off and float inside of the joint.  Small deposits of bone (osteophytes) that grow on the edges of the joint.  A grating or scraping feeling inside the joint when you move it.  Popping or creaking sounds when you move.  Difficulty walking or exercising.  An inability to grip items, twist your hand(s), or control the movements of your hands and fingers. How is this diagnosed? This condition may be diagnosed based on:  Your medical history.  A physical exam.  Your symptoms.  X-rays of the affected joint(s).  Blood tests to rule out other types of arthritis. How is this treated? There is no cure for this condition, but treatment can help control  pain and improve joint function. Treatment may include a combination of therapies, such as:  Pain relief techniques, such as: ? Applying heat and cold to the joint. ? Massage. ? A form of talk therapy called cognitive behavioral therapy (CBT). This therapy helps you set goals and follow up on the changes that you make.  Medicines for pain and inflammation. The medicines can be taken by mouth or applied to the skin. They include: ? NSAIDs, such as ibuprofen. ? Prescription medicines. ? Strong anti-inflammatory medicines (corticosteroids). ? Certain nutritional supplements.  A prescribed exercise program. You may work with a physical therapist.  Assistive devices, such as a brace, wrap, splint, specialized glove, or cane.  A weight control plan.  Surgery, such as: ? An osteotomy. This is done to reposition the bones and relieve pain or to remove loose pieces of bone and cartilage. ? Joint replacement surgery. You may need this surgery if you have advanced osteoarthritis. Follow these instructions at home: Activity  Rest your affected joints as told by your health care provider.  Exercise as told by your health care provider. He or she may recommend specific types of exercise, such as: ? Strengthening exercises. These are done to strengthen the muscles that support joints affected by arthritis. ? Aerobic activities. These are exercises, such as brisk walking or water aerobics, that increase your heart rate. ? Range-of-motion activities. These help your joints move more easily. ? Balance and agility exercises. Managing pain, stiffness, and swelling  If directed, apply heat to the affected area as often   as told by your health care provider. Use the heat source that your health care provider recommends, such as a moist heat pack or a heating pad. ? If you have a removable assistive device, remove it as told by your health care provider. ? Place a towel between your skin and the heat  source. If your health care provider tells you to keep the assistive device on while you apply heat, place a towel between the assistive device and the heat source. ? Leave the heat on for 20-30 minutes. ? Remove the heat if your skin turns bright red. This is especially important if you are unable to feel pain, heat, or cold. You may have a greater risk of getting burned.  If directed, put ice on the affected area. To do this: ? If you have a removable assistive device, remove it as told by your health care provider. ? Put ice in a plastic bag. ? Place a towel between your skin and the bag. If your health care provider tells you to keep the assistive device on during icing, place a towel between the assistive device and the bag. ? Leave the ice on for 20 minutes, 2-3 times a day. ? Move your fingers or toes often to reduce stiffness and swelling. ? Raise (elevate) the injured area above the level of your heart while you are sitting or lying down.      General instructions  Take over-the-counter and prescription medicines only as told by your health care provider.  Maintain a healthy weight. Follow instructions from your health care provider for weight control.  Do not use any products that contain nicotine or tobacco, such as cigarettes, e-cigarettes, and chewing tobacco. If you need help quitting, ask your health care provider.  Use assistive devices as told by your health care provider.  Keep all follow-up visits as told by your health care provider. This is important. Where to find more information  National Institute of Arthritis and Musculoskeletal and Skin Diseases: www.niams.nih.gov  National Institute on Aging: www.nia.nih.gov  American College of Rheumatology: www.rheumatology.org Contact a health care provider if:  You have redness, swelling, or a feeling of warmth in a joint that gets worse.  You have a fever along with joint or muscle aches.  You develop a  rash.  You have trouble doing your normal activities. Get help right away if:  You have pain that gets worse and is not relieved by pain medicine. Summary  Osteoarthritis is a type of arthritis that affects tissue covering the ends of bones in joints (cartilage).  This condition is caused by the wearing down of cartilage that covers the ends of bones.  The main symptom of this condition is pain, swelling, and stiffness in the joint.  There is no cure for this condition, but treatment can help control pain and improve joint function. This information is not intended to replace advice given to you by your health care provider. Make sure you discuss any questions you have with your health care provider. Document Revised: 08/19/2019 Document Reviewed: 08/19/2019 Elsevier Patient Education  2021 Elsevier Inc.  

## 2020-11-10 NOTE — Progress Notes (Signed)
Subjective:    Patient ID: Madeline Clark, female    DOB: 1955-10-04, 65 y.o.   MRN: [id number]  Chief Complaint  Patient presents with  . Medical Management of Chronic Issues  . Hypertension  . Insomnia   Pt presents to the office today for chronic follow up. Hypertension This is a chronic problem. The current episode started more than 1 year ago. The problem has been resolved since onset. The problem is controlled. Associated symptoms include malaise/fatigue. Pertinent negatives include no peripheral edema or shortness of breath. Risk factors for coronary artery disease include dyslipidemia, obesity and sedentary lifestyle. The current treatment provides moderate improvement. There is no history of kidney disease or heart failure. Identifiable causes of hypertension include a thyroid problem.  Insomnia Primary symptoms: difficulty falling asleep, frequent awakening, malaise/fatigue.  The current episode started more than one year. The onset quality is gradual. The problem occurs intermittently. The treatment provided moderate relief.  Thyroid Problem Presents for follow-up visit. Patient reports no anxiety, fatigue or nail problem. The symptoms have been stable. Her past medical history is significant for hyperlipidemia. There is no history of heart failure.  Arthritis Presents for follow-up visit. She complains of pain and stiffness. The symptoms have been stable. Affected locations include the right MCP and left MCP. Her pain is at a severity of 3/10. Pertinent negatives include no fatigue.  Hyperlipidemia This is a chronic problem. The current episode started more than 1 year ago. The problem is controlled. Pertinent negatives include no shortness of breath. Current antihyperlipidemic treatment includes statins. The current treatment provides moderate improvement of lipids. Risk factors for coronary artery disease include hypertension, a sedentary lifestyle, diabetes mellitus and  dyslipidemia.      Review of Systems  Constitutional: Positive for malaise/fatigue. Negative for fatigue.  Respiratory: Negative for shortness of breath.   Musculoskeletal: Positive for arthritis and stiffness.  Psychiatric/Behavioral: The patient has insomnia. The patient is not nervous/anxious.   All other systems reviewed and are negative.      Objective:   Physical Exam Vitals reviewed.  Constitutional:      General: She is not in acute distress.    Appearance: She is well-developed and well-nourished.  HENT:     Head: Normocephalic and atraumatic.     Right Ear: Tympanic membrane normal.     Left Ear: Tympanic membrane normal.     Mouth/Throat:     Mouth: Oropharynx is clear and moist.  Eyes:     Pupils: Pupils are equal, round, and reactive to light.  Neck:     Thyroid: No thyromegaly.  Cardiovascular:     Rate and Rhythm: Normal rate and regular rhythm.     Pulses: Intact distal pulses.     Heart sounds: Normal heart sounds. No murmur heard.   Pulmonary:     Effort: Pulmonary effort is normal. No respiratory distress.     Breath sounds: Normal breath sounds. No wheezing.  Abdominal:     General: Bowel sounds are normal. There is no distension.     Palpations: Abdomen is soft.     Tenderness: There is no abdominal tenderness.  Musculoskeletal:        General: No tenderness or edema. Normal range of motion.     Cervical back: Normal range of motion and neck supple.  Skin:    General: Skin is warm and dry.  Neurological:     Mental Status: She is alert and oriented to person, place, and time.  Cranial Nerves: No cranial nerve deficit.     Deep Tendon Reflexes: Reflexes are normal and symmetric.  Psychiatric:        Mood and Affect: Mood and affect normal.        Behavior: Behavior normal.        Thought Content: Thought content normal.        Judgment: Judgment normal.       BP 119/65   Pulse 60   Temp (!) 97.3 F (36.3 C) (Temporal)   Ht 5' 2"   (1.575 m)   Wt 222 lb 9.6 oz (101 kg)   SpO2 100%   BMI 40.71 kg/m      Assessment & Plan:  Madeline Clark comes in today with chief complaint of Medical Management of Chronic Issues, Hypertension, and Insomnia   Diagnosis and orders addressed:  1. Essential hypertension, benign - lisinopril (ZESTRIL) 20 MG tablet; Take 1 tablet (20 mg total) by mouth daily.  Dispense: 90 tablet; Refill: 3 - hydrochlorothiazide (HYDRODIURIL) 25 MG tablet; Take 1 tablet (25 mg total) by mouth daily.  Dispense: 90 tablet; Refill: 3 - CMP14+EGFR - CBC with Differential/Platelet  2. Hyperlipidemia with target LDL less than 100 - simvastatin (ZOCOR) 40 MG tablet; Take 1 tablet (40 mg total) by mouth at bedtime. (Needs to be seen before next refill)  Dispense: 90 tablet; Refill: 3 - CMP14+EGFR - CBC with Differential/Platelet - Lipid panel  3. Hypothyroidism due to acquired atrophy of thyroid - CMP14+EGFR - CBC with Differential/Platelet - TSH  4. Primary osteoarthritis of right hand - CMP14+EGFR - CBC with Differential/Platelet - diclofenac (VOLTAREN) 75 MG EC tablet; Take 1 tablet (75 mg total) by mouth 2 (two) times daily.  Dispense: 60 tablet; Refill: 2  5. Obesity (BMI 30-39.9) - CMP14+EGFR - CBC with Differential/Platelet  6. Allergic rhinitis due to pollen, unspecified seasonality - cetirizine (ZYRTEC) 10 MG tablet; Take 1 tablet (10 mg total) by mouth daily.  Dispense: 90 tablet; Refill: 4 - fluticasone (FLONASE) 50 MCG/ACT nasal spray; Place 2 sprays into both nostrils daily.  Dispense: 16 g; Refill: 6   Labs pending Health Maintenance reviewed Diet and exercise encouraged  Follow up plan: 6 months    Evelina Dun, FNP

## 2020-11-11 LAB — CMP14+EGFR
ALT: 11 IU/L (ref 0–32)
AST: 16 IU/L (ref 0–40)
Albumin/Globulin Ratio: 2 (ref 1.2–2.2)
Albumin: 4.5 g/dL (ref 3.8–4.8)
Alkaline Phosphatase: 81 IU/L (ref 44–121)
BUN/Creatinine Ratio: 15 (ref 12–28)
BUN: 15 mg/dL (ref 8–27)
Bilirubin Total: 0.6 mg/dL (ref 0.0–1.2)
CO2: 24 mmol/L (ref 20–29)
Calcium: 9.7 mg/dL (ref 8.7–10.3)
Chloride: 104 mmol/L (ref 96–106)
Creatinine, Ser: 1.03 mg/dL — ABNORMAL HIGH (ref 0.57–1.00)
Globulin, Total: 2.2 g/dL (ref 1.5–4.5)
Glucose: 96 mg/dL (ref 65–99)
Potassium: 4.7 mmol/L (ref 3.5–5.2)
Sodium: 142 mmol/L (ref 134–144)
Total Protein: 6.7 g/dL (ref 6.0–8.5)
eGFR: 61 mL/min/{1.73_m2} (ref >59)

## 2020-11-11 LAB — CBC WITH DIFFERENTIAL/PLATELET
Basophils Absolute: 0.1 10*3/uL (ref 0.0–0.2)
Basos: 2 %
EOS (ABSOLUTE): 0.4 10*3/uL (ref 0.0–0.4)
Eos: 6 %
Hematocrit: 43.7 % (ref 34.0–46.6)
Hemoglobin: 14.7 g/dL (ref 11.1–15.9)
Immature Grans (Abs): 0.1 10*3/uL (ref 0.0–0.1)
Immature Granulocytes: 2 %
Lymphocytes Absolute: 2 10*3/uL (ref 0.7–3.1)
Lymphs: 28 %
MCH: 30.4 pg (ref 26.6–33.0)
MCHC: 33.6 g/dL (ref 31.5–35.7)
MCV: 91 fL (ref 79–97)
Monocytes Absolute: 0.4 10*3/uL (ref 0.1–0.9)
Monocytes: 6 %
Neutrophils Absolute: 4.1 10*3/uL (ref 1.4–7.0)
Neutrophils: 56 %
Platelets: 281 10*3/uL (ref 150–450)
RBC: 4.83 x10E6/uL (ref 3.77–5.28)
RDW: 11.9 % (ref 11.7–15.4)
WBC: 7.3 10*3/uL (ref 3.4–10.8)

## 2020-11-11 LAB — LIPID PANEL
Chol/HDL Ratio: 2.5 ratio (ref 0.0–4.4)
Cholesterol, Total: 124 mg/dL (ref 100–199)
HDL: 49 mg/dL (ref >39)
LDL Chol Calc (NIH): 56 mg/dL (ref 0–99)
Triglycerides: 102 mg/dL (ref 0–149)
VLDL Cholesterol Cal: 19 mg/dL (ref 5–40)

## 2020-11-11 LAB — TSH: TSH: 3.05 u[IU]/mL (ref 0.450–4.500)

## 2020-11-17 ENCOUNTER — Telehealth: Payer: Self-pay

## 2020-11-17 ENCOUNTER — Other Ambulatory Visit: Payer: Self-pay | Admitting: Family Medicine

## 2020-11-17 MED ORDER — LEVOTHYROXINE SODIUM 100 MCG PO TABS: 100.0000 ug | ORAL_TABLET | Freq: Every day | ORAL | 3 refills | Status: DC

## 2020-11-17 NOTE — Telephone Encounter (Signed)
Patient aware and verbalized understanding. °

## 2021-01-05 ENCOUNTER — Encounter: Payer: Self-pay | Admitting: Family Medicine

## 2021-01-20 ENCOUNTER — Encounter: Payer: Self-pay | Admitting: Family Medicine

## 2021-05-13 ENCOUNTER — Encounter: Payer: Self-pay | Admitting: Family

## 2021-05-13 ENCOUNTER — Ambulatory Visit (INDEPENDENT_AMBULATORY_CARE_PROVIDER_SITE_OTHER): Payer: BC Managed Care – PPO | Admitting: Family

## 2021-05-13 ENCOUNTER — Other Ambulatory Visit: Payer: Self-pay

## 2021-05-13 ENCOUNTER — Other Ambulatory Visit (HOSPITAL_COMMUNITY)
Admission: RE | Admit: 2021-05-13 | Discharge: 2021-05-13 | Disposition: A | Discharge status: Home / Self Care | Payer: BC Managed Care – PPO | Source: Ambulatory Visit | Attending: Family | Admitting: Family

## 2021-05-13 VITALS — BP 101/68 | HR 65 | Temp 97.5°F | Ht 62.0 in | Wt 225.2 lb

## 2021-05-13 DIAGNOSIS — Z01419 Encounter for gynecological examination (general) (routine) without abnormal findings: Secondary | ICD-10-CM | POA: Insufficient documentation

## 2021-05-13 DIAGNOSIS — Z0000 Encounter for general adult medical examination without abnormal findings: Secondary | ICD-10-CM | POA: Diagnosis present

## 2021-05-13 DIAGNOSIS — Z0001 Encounter for general adult medical examination with abnormal findings: Secondary | ICD-10-CM | POA: Diagnosis not present

## 2021-05-13 DIAGNOSIS — E785 Hyperlipidemia, unspecified: Secondary | ICD-10-CM

## 2021-05-13 DIAGNOSIS — E669 Obesity, unspecified: Secondary | ICD-10-CM

## 2021-05-13 DIAGNOSIS — Z23 Encounter for immunization: Secondary | ICD-10-CM | POA: Diagnosis not present

## 2021-05-13 DIAGNOSIS — M19041 Primary osteoarthritis, right hand: Secondary | ICD-10-CM

## 2021-05-13 DIAGNOSIS — E034 Atrophy of thyroid (acquired): Secondary | ICD-10-CM

## 2021-05-13 DIAGNOSIS — I10 Essential (primary) hypertension: Secondary | ICD-10-CM | POA: Diagnosis not present

## 2021-05-13 NOTE — Patient Instructions (Signed)
Health Maintenance, Female Adopting a healthy lifestyle and getting preventive care are important in promoting health and wellness. Ask your health care provider about: The right schedule for you to have regular tests and exams. Things you can do on your own to prevent diseases and keep yourself healthy. What should I know about diet, weight, and exercise? Eat a healthy diet  Eat a diet that includes plenty of vegetables, fruits, low-fat dairy products, and lean protein. Do not eat a lot of foods that are high in solid fats, added sugars, or sodium. Maintain a healthy weight Body mass index (BMI) is used to identify weight problems. It estimates body fat based on height and weight. Your health care provider can help determine your BMI and help you achieve or maintain a healthy weight. Get regular exercise Get regular exercise. This is one of the most important things you can do for your health. Most adults should: Exercise for at least 150 minutes each week. The exercise should increase your heart rate and make you sweat (moderate-intensity exercise). Do strengthening exercises at least twice a week. This is in addition to the moderate-intensity exercise. Spend less time sitting. Even light physical activity can be beneficial. Watch cholesterol and blood lipids Have your blood tested for lipids and cholesterol at 63 years of age, then have this test every 5 years. Have your cholesterol levels checked more often if: Your lipid or cholesterol levels are high. You are older than 63 years of age. You are at high risk for heart disease. What should I know about cancer screening? Depending on your health history and family history, you may need to have cancer screening at various ages. This may include screening for: Breast cancer. Cervical cancer. Colorectal cancer. Skin cancer. Lung cancer. What should I know about heart disease, diabetes, and high blood pressure? Blood pressure and heart  disease High blood pressure causes heart disease and increases the risk of stroke. This is more likely to develop in people who have high blood pressure readings, are of African descent, or are overweight. Have your blood pressure checked: Every 3-5 years if you are 18-39 years of age. Every year if you are 40 years old or older. Diabetes Have regular diabetes screenings. This checks your fasting blood sugar level. Have the screening done: Once every three years after age 40 if you are at a normal weight and have a low risk for diabetes. More often and at a younger age if you are overweight or have a high risk for diabetes. What should I know about preventing infection? Hepatitis B If you have a higher risk for hepatitis B, you should be screened for this virus. Talk with your health care provider to find out if you are at risk for hepatitis B infection. Hepatitis C Testing is recommended for: Everyone born from 1945 through 1965. Anyone with known risk factors for hepatitis C. Sexually transmitted infections (STIs) Get screened for STIs, including gonorrhea and chlamydia, if: You are sexually active and are younger than 63 years of age. You are older than 63 years of age and your health care provider tells you that you are at risk for this type of infection. Your sexual activity has changed since you were last screened, and you are at increased risk for chlamydia or gonorrhea. Ask your health care provider if you are at risk. Ask your health care provider about whether you are at high risk for HIV. Your health care provider may recommend a prescription medicine   to help prevent HIV infection. If you choose to take medicine to prevent HIV, you should first get tested for HIV. You should then be tested every 3 months for as long as you are taking the medicine. Pregnancy If you are about to stop having your period (premenopausal) and you may become pregnant, seek counseling before you get  pregnant. Take 400 to 800 micrograms (mcg) of folic acid every day if you become pregnant. Ask for birth control (contraception) if you want to prevent pregnancy. Osteoporosis and menopause Osteoporosis is a disease in which the bones lose minerals and strength with aging. This can result in bone fractures. If you are 65 years old or older, or if you are at risk for osteoporosis and fractures, ask your health care provider if you should: Be screened for bone loss. Take a calcium or vitamin D supplement to lower your risk of fractures. Be given hormone replacement therapy (HRT) to treat symptoms of menopause. Follow these instructions at home: Lifestyle Do not use any products that contain nicotine or tobacco, such as cigarettes, e-cigarettes, and chewing tobacco. If you need help quitting, ask your health care provider. Do not use street drugs. Do not share needles. Ask your health care provider for help if you need support or information about quitting drugs. Alcohol use Do not drink alcohol if: Your health care provider tells you not to drink. You are pregnant, may be pregnant, or are planning to become pregnant. If you drink alcohol: Limit how much you use to 0-1 drink a day. Limit intake if you are breastfeeding. Be aware of how much alcohol is in your drink. In the [place] one drink equals one 12 oz bottle of beer (355 mL), one 5 oz glass of wine (148 mL), or one 1 oz glass of hard liquor (44 mL). General instructions Schedule regular health, dental, and eye exams. Stay current with your vaccines. Tell your health care provider if: You often feel depressed. You have ever been abused or do not feel safe at home. Summary Adopting a healthy lifestyle and getting preventive care are important in promoting health and wellness. Follow your health care provider's instructions about healthy diet, exercising, and getting tested or screened for diseases. Follow your health care provider's  instructions on monitoring your cholesterol and blood pressure. This information is not intended to replace advice given to you by your health care provider. Make sure you discuss any questions you have with your health care provider. Document Revised: 10/30/2020 Document Reviewed: 08/15/2018 Elsevier Patient Education  2022 Elsevier Inc.  

## 2021-05-13 NOTE — Progress Notes (Signed)
Subjective:    Patient ID: Madeline Clark, female    DOB: December 26, 1955, 65 y.o.   MRN: [id number]  Chief Complaint  Patient presents with   Annual Exam    PAP   Pt presents to the office today for CPE with pap.  Gynecologic Exam The patient's pertinent negatives include no genital itching, genital lesions, vaginal bleeding or vaginal discharge.  Hypertension This is a chronic problem. The current episode started more than 1 year ago. The problem has been resolved since onset. The problem is controlled. Pertinent negatives include no malaise/fatigue, peripheral edema or shortness of breath. Risk factors for coronary artery disease include dyslipidemia, obesity and sedentary lifestyle. Past treatments include ACE inhibitors. The current treatment provides moderate improvement. Identifiable causes of hypertension include a thyroid problem.  Thyroid Problem Presents for follow-up visit. Patient reports no cold intolerance, depressed mood, fatigue, hair loss or weight gain. The symptoms have been stable. Her past medical history is significant for hyperlipidemia.  Arthritis Presents for follow-up visit. She complains of pain and stiffness. The symptoms have been stable. Affected locations include the left MCP and right MCP. Her pain is at a severity of 0/10 (when it hurts 5 out 10, after sewing). Pertinent negatives include no fatigue.  Hyperlipidemia This is a chronic problem. The current episode started more than 1 year ago. The problem is controlled. Recent lipid tests were reviewed and are normal. Exacerbating diseases include obesity. Pertinent negatives include no shortness of breath. Current antihyperlipidemic treatment includes statins. The current treatment provides moderate improvement of lipids. Risk factors for coronary artery disease include dyslipidemia, hypertension, a sedentary lifestyle and post-menopausal.     Review of Systems  Constitutional:  Negative for fatigue,  malaise/fatigue and weight gain.  Respiratory:  Negative for shortness of breath.   Endocrine: Negative for cold intolerance.  Genitourinary:  Negative for vaginal discharge.  Musculoskeletal:  Positive for arthritis and stiffness.  All other systems reviewed and are negative.     Objective:   Physical Exam Vitals reviewed.  Constitutional:      General: She is not in acute distress.    Appearance: She is well-developed. She is obese.  HENT:     Head: Normocephalic and atraumatic.     Right Ear: Tympanic membrane normal.     Left Ear: Tympanic membrane normal.  Eyes:     Pupils: Pupils are equal, round, and reactive to light.  Neck:     Thyroid: No thyromegaly.  Cardiovascular:     Rate and Rhythm: Normal rate and regular rhythm.     Heart sounds: Normal heart sounds. No murmur heard. Pulmonary:     Effort: Pulmonary effort is normal. No respiratory distress.     Breath sounds: Normal breath sounds. No wheezing.  Chest:  Breasts:    Right: No swelling, bleeding, inverted nipple, mass, nipple discharge, skin change or tenderness.     Left: No swelling, bleeding, inverted nipple, mass, nipple discharge, skin change or tenderness.  Abdominal:     General: Bowel sounds are normal. There is no distension.     Palpations: Abdomen is soft.     Tenderness: There is no abdominal tenderness.  Genitourinary:    Comments: Bimanual exam- no adnexal masses or tenderness, ovaries nonpalpable   Cervix parous and pink- No discharge  Musculoskeletal:        General: No tenderness. Normal range of motion.     Cervical back: Normal range of motion and neck supple.  Skin:  General: Skin is warm and dry.  Neurological:     Mental Status: She is alert and oriented to person, place, and time.     Cranial Nerves: No cranial nerve deficit.     Deep Tendon Reflexes: Reflexes are normal and symmetric.  Psychiatric:        Behavior: Behavior normal.        Thought Content: Thought content  normal.        Judgment: Judgment normal.      BP 101/68   Pulse 65   Temp (!) 97.5 F (36.4 C) (Temporal)   Ht _0  (1.575 m)   Wt 225 lb 3.2 oz (102.2 kg)   BMI 41.19 kg/m      Assessment & Plan:  Madeline Clark comes in today with chief complaint of Annual Exam (PAP)   Diagnosis and orders addressed:  1. Annual physical exam - CMP14+EGFR - CBC with Differential/Platelet - Lipid panel - TSH - Cytology - PAP([place])  2. Encounter for gynecological examination without abnormal finding - CMP14+EGFR - CBC with Differential/Platelet - Cytology - PAP([place])  3. Essential hypertension, benign - CMP14+EGFR - CBC with Differential/Platelet  4. Hypothyroidism due to acquired atrophy of thyroid - CMP14+EGFR - CBC with Differential/Platelet  5. Hyperlipidemia with target LDL less than 100 - CMP14+EGFR - CBC with Differential/Platelet  6. Primary osteoarthritis of right hand - CMP14+EGFR - CBC with Differential/Platelet  7. Obesity (BMI 30-39.9) - CMP14+EGFR - CBC with Differential/Platelet   Labs pending Health Maintenance reviewed Diet and exercise encouraged  Follow up plan: 6 months    Evelina Dun, FNP

## 2021-05-14 ENCOUNTER — Other Ambulatory Visit: Payer: Self-pay | Admitting: Family

## 2021-05-14 ENCOUNTER — Other Ambulatory Visit: Payer: Self-pay | Admitting: Family Medicine

## 2021-05-14 DIAGNOSIS — E034 Atrophy of thyroid (acquired): Secondary | ICD-10-CM

## 2021-05-14 LAB — LIPID PANEL
Chol/HDL Ratio: 2.9 ratio (ref 0.0–4.4)
Cholesterol, Total: 143 mg/dL (ref 100–199)
HDL: 50 mg/dL (ref >39)
LDL Chol Calc (NIH): 71 mg/dL (ref 0–99)
Triglycerides: 122 mg/dL (ref 0–149)
VLDL Cholesterol Cal: 22 mg/dL (ref 5–40)

## 2021-05-14 LAB — CBC WITH DIFFERENTIAL/PLATELET
Basophils Absolute: 0.1 10*3/uL (ref 0.0–0.2)
Basos: 1 %
EOS (ABSOLUTE): 0.3 10*3/uL (ref 0.0–0.4)
Eos: 4 %
Hematocrit: 44.6 % (ref 34.0–46.6)
Hemoglobin: 14.7 g/dL (ref 11.1–15.9)
Immature Grans (Abs): 0.1 10*3/uL (ref 0.0–0.1)
Immature Granulocytes: 1 %
Lymphocytes Absolute: 2.3 10*3/uL (ref 0.7–3.1)
Lymphs: 27 %
MCH: 29.6 pg (ref 26.6–33.0)
MCHC: 33 g/dL (ref 31.5–35.7)
MCV: 90 fL (ref 79–97)
Monocytes Absolute: 0.5 10*3/uL (ref 0.1–0.9)
Monocytes: 6 %
Neutrophils Absolute: 5.3 10*3/uL (ref 1.4–7.0)
Neutrophils: 61 %
Platelets: 286 10*3/uL (ref 150–450)
RBC: 4.96 x10E6/uL (ref 3.77–5.28)
RDW: 12.3 % (ref 11.7–15.4)
WBC: 8.6 10*3/uL (ref 3.4–10.8)

## 2021-05-14 LAB — CMP14+EGFR
ALT: 14 IU/L (ref 0–32)
AST: 21 IU/L (ref 0–40)
Albumin/Globulin Ratio: 2.1 (ref 1.2–2.2)
Albumin: 4.7 g/dL (ref 3.8–4.8)
Alkaline Phosphatase: 79 IU/L (ref 44–121)
BUN/Creatinine Ratio: 10 — ABNORMAL LOW (ref 12–28)
BUN: 13 mg/dL (ref 8–27)
Bilirubin Total: 0.7 mg/dL (ref 0.0–1.2)
CO2: 24 mmol/L (ref 20–29)
Calcium: 9.7 mg/dL (ref 8.7–10.3)
Chloride: 101 mmol/L (ref 96–106)
Creatinine, Ser: 1.25 mg/dL — ABNORMAL HIGH (ref 0.57–1.00)
Globulin, Total: 2.2 g/dL (ref 1.5–4.5)
Glucose: 95 mg/dL (ref 65–99)
Potassium: 4.6 mmol/L (ref 3.5–5.2)
Sodium: 143 mmol/L (ref 134–144)
Total Protein: 6.9 g/dL (ref 6.0–8.5)
eGFR: 48 mL/min/{1.73_m2} — ABNORMAL LOW (ref >59)

## 2021-05-14 LAB — TSH: TSH: 5.67 u[IU]/mL — ABNORMAL HIGH (ref 0.450–4.500)

## 2021-05-14 MED ORDER — LEVOTHYROXINE SODIUM 112 MCG PO TABS: 112.0000 ug | ORAL_TABLET | Freq: Every day | ORAL | 11 refills | Status: DC

## 2021-05-18 LAB — CYTOLOGY - PAP: Diagnosis: NEGATIVE

## 2021-08-11 ENCOUNTER — Ambulatory Visit (INDEPENDENT_AMBULATORY_CARE_PROVIDER_SITE_OTHER): Payer: BC Managed Care – PPO | Admitting: Family Medicine

## 2021-08-11 ENCOUNTER — Encounter: Payer: Self-pay | Admitting: Family Medicine

## 2021-08-11 DIAGNOSIS — Z20828 Contact with and (suspected) exposure to other viral communicable diseases: Secondary | ICD-10-CM

## 2021-08-11 DIAGNOSIS — R6889 Other general symptoms and signs: Secondary | ICD-10-CM

## 2021-08-11 MED ORDER — OSELTAMIVIR PHOSPHATE 75 MG PO CAPS: 75.0000 mg | ORAL_CAPSULE | Freq: Two times a day (BID) | ORAL | 0 refills | Status: AC

## 2021-08-11 NOTE — Progress Notes (Signed)
Virtual Visit via telephone Note Due to COVID-19 pandemic this visit was conducted virtually. This visit type was conducted due to national recommendations for restrictions regarding the COVID-19 Pandemic (e.g. social distancing, sheltering in place) in an effort to limit this patient's exposure and mitigate transmission in our community. All issues noted in this document were discussed and addressed.  A physical exam was not performed with this format.   I connected with Shefali Choung's husband on 08/11/2021 at [phone] by telephone and verified that I am speaking with the correct person using two identifiers. Madeline Clark is currently located at home and family is currently with them during visit. The provider, Monia Pouch, FNP is located in their office at time of visit.  I discussed the limitations, risks, security and privacy concerns of performing an evaluation and management service by telephone and the availability of in person appointments. I also discussed with the patient that there may be a patient responsible charge related to this service. The patient expressed understanding and agreed to proceed.  Subjective:  Patient ID: Madeline Clark, female    DOB: Apr 24, 1956, 65 y.o.   MRN: [id number]  Chief Complaint:  Influenza   HPI: Madeline Clark is a 65 y.o. female presenting on 08/11/2021 for Influenza   Reports fever, chills, cough, congestion, and myalgias for 2 days. Known exposure to influenza.   Influenza This is a new problem. The current episode started yesterday. The problem has been gradually worsening. Associated symptoms include chills, congestion, coughing, fatigue, a fever and myalgias. Pertinent negatives include no diaphoresis, headaches, numbness or weakness. She has tried acetaminophen and NSAIDs for the symptoms. The treatment provided mild relief.    Relevant past medical, surgical, family, and social history reviewed and updated as indicated.  Allergies and  medications reviewed and updated.   Past Medical History:  Diagnosis Date   Hyperlipidemia    Hypertension    Thyroid disease    hypoactive    Past Surgical History:  Procedure Laterality Date   BREAST SURGERY  2009   knots removed from r breast   COLONOSCOPY      Social History   Socioeconomic History   Marital status: Unknown    Spouse name: Not on file   Number of children: Not on file   Years of education: Not on file   Highest education level: Not on file  Occupational History    Employer: [employer]    Comment: retired  Tobacco Use   Smoking status: Never   Smokeless tobacco: Never  Vaping Use   Vaping Use: Never used  Substance and Sexual Activity   Alcohol use: No   Drug use: No   Sexual activity: Not on file  Other Topics Concern   Not on file  Social History Narrative   Not on file   Social Determinants of Health   Financial Resource Strain: Not on file  Food Insecurity: Not on file  Transportation Needs: Not on file  Physical Activity: Not on file  Stress: Not on file  Social Connections: Not on file  Intimate Partner Violence: Not on file    Outpatient Encounter Medications as of 08/11/2021  Medication Sig   oseltamivir (TAMIFLU) 75 MG capsule Take 1 capsule (75 mg total) by mouth 2 (two) times daily for 5 days.   diclofenac (VOLTAREN) 75 MG EC tablet Take 1 tablet (75 mg total) by mouth 2 (two) times daily.   fluticasone (FLONASE) 50 MCG/ACT nasal spray Place 2 sprays  into both nostrils daily.   levothyroxine (SYNTHROID) 112 MCG tablet Take 1 tablet (112 mcg total) by mouth daily.   lisinopril (ZESTRIL) 20 MG tablet Take 1 tablet (20 mg total) by mouth daily.   simvastatin (ZOCOR) 40 MG tablet Take 1 tablet (40 mg total) by mouth at bedtime. (Needs to be seen before next refill)   No facility-administered encounter medications on file as of 08/11/2021.    No Known Allergies  Review of Systems  Constitutional:  Positive for  activity change, appetite change, chills, fatigue and fever. Negative for diaphoresis and unexpected weight change.  HENT:  Positive for congestion, postnasal drip and rhinorrhea.   Eyes:  Negative for photophobia and visual disturbance.  Respiratory:  Positive for cough. Negative for shortness of breath.   Genitourinary:  Negative for decreased urine volume and difficulty urinating.  Musculoskeletal:  Positive for myalgias.  Neurological:  Negative for dizziness, tremors, seizures, syncope, facial asymmetry, speech difficulty, weakness, light-headedness, numbness and headaches.  Psychiatric/Behavioral:  Negative for confusion.   All other systems reviewed and are negative.       Observations/Objective: No vital signs or physical exam, this was a telephone or virtual health encounter.  Pt alert and oriented, answers all questions appropriately, and able to speak in full sentences.    Assessment and Plan: Destina was seen today for influenza.  Diagnoses and all orders for this visit:  Flu-like symptoms Exposure to influenza Flu-like symptoms with positive close exposure. Continue tylenol, motrin, and decongestants. Will add Tamiflu as prescribed. Report any new, worsening, or persistent symptoms. Follow up as needed.  -     oseltamivir (TAMIFLU) 75 MG capsule; Take 1 capsule (75 mg total) by mouth 2 (two) times daily for 5 days.     Follow Up Instructions: Return if symptoms worsen or fail to improve.    I discussed the assessment and treatment plan with the patient. The patient was provided an opportunity to ask questions and all were answered. The patient agreed with the plan and demonstrated an understanding of the instructions.   The patient was advised to call back or seek an in-person evaluation if the symptoms worsen or if the condition fails to improve as anticipated.  The above assessment and management plan was discussed with the patient. The patient verbalized  understanding of and has agreed to the management plan. Patient is aware to call the clinic if they develop any new symptoms or if symptoms persist or worsen. Patient is aware when to return to the clinic for a follow-up visit. Patient educated on when it is appropriate to go to the emergency department.    I provided 11 minutes of non-face-to-face time during this encounter. The call started at 0924. The call ended at 0934. The other time was used for coordination of care.    Monia Pouch, FNP-C [facility] [address] [place], [place] [address] [phone] 08/11/2021

## 2021-10-05 ENCOUNTER — Other Ambulatory Visit: Payer: Self-pay | Admitting: Family

## 2021-10-05 DIAGNOSIS — I10 Essential (primary) hypertension: Secondary | ICD-10-CM

## 2021-11-10 ENCOUNTER — Encounter: Payer: Self-pay | Admitting: Family

## 2021-11-10 ENCOUNTER — Ambulatory Visit: Payer: BC Managed Care – PPO | Admitting: Family

## 2021-11-10 VITALS — BP 107/72 | HR 71 | Temp 98.0°F | Ht 62.0 in | Wt 222.6 lb

## 2021-11-10 DIAGNOSIS — M19041 Primary osteoarthritis, right hand: Secondary | ICD-10-CM

## 2021-11-10 DIAGNOSIS — I10 Essential (primary) hypertension: Secondary | ICD-10-CM | POA: Diagnosis not present

## 2021-11-10 DIAGNOSIS — E785 Hyperlipidemia, unspecified: Secondary | ICD-10-CM

## 2021-11-10 DIAGNOSIS — E034 Atrophy of thyroid (acquired): Secondary | ICD-10-CM

## 2021-11-10 DIAGNOSIS — Z23 Encounter for immunization: Secondary | ICD-10-CM | POA: Diagnosis not present

## 2021-11-10 DIAGNOSIS — L989 Disorder of the skin and subcutaneous tissue, unspecified: Secondary | ICD-10-CM

## 2021-11-10 DIAGNOSIS — E669 Obesity, unspecified: Secondary | ICD-10-CM

## 2021-11-10 MED ORDER — DICLOFENAC SODIUM 75 MG PO TBEC: 75.0000 mg | DELAYED_RELEASE_TABLET | Freq: Two times a day (BID) | ORAL | 2 refills | Status: DC

## 2021-11-10 NOTE — Patient Instructions (Signed)

## 2021-11-10 NOTE — Progress Notes (Signed)
? ?Subjective:  ? ? Patient ID: Madeline Clark, female    DOB: 1956/04/09, 66 y.o.   MRN: [id number] ? ?Chief Complaint  ?Patient presents with  ? Medical Management of Chronic Issues  ?  Spot on face she is concerned about been on face for years looks like it has size.   ? ?Pt presents to the office today for chronic follow up. She reports she has had a skin lesion on her face for years, but has grown over the last few years. It has become darker and larger. Denies any drainage or pain.  ?Hypertension ?This is a chronic problem. The current episode started more than 1 year ago. The problem has been resolved since onset. The problem is controlled. Associated symptoms include malaise/fatigue. Pertinent negatives include no peripheral edema or shortness of breath. Risk factors for coronary artery disease include dyslipidemia, obesity and sedentary lifestyle. The current treatment provides moderate improvement. Identifiable causes of hypertension include a thyroid problem.  ?Thyroid Problem ?Presents for follow-up visit. Symptoms include fatigue. Patient reports no depressed mood, hoarse voice or tremors. The symptoms have been stable. Her past medical history is significant for hyperlipidemia.  ?Arthritis ?Presents for follow-up visit. She complains of pain and stiffness. The symptoms have been stable. Affected locations include the left MCP and right MCP. Her pain is at a severity of 2/10. Associated symptoms include fatigue.  ?Hyperlipidemia ?This is a chronic problem. The current episode started more than 1 year ago. Exacerbating diseases include obesity. Pertinent negatives include no shortness of breath. Current antihyperlipidemic treatment includes statins. The current treatment provides moderate improvement of lipids. Risk factors for coronary artery disease include dyslipidemia, hypertension, a sedentary lifestyle and post-menopausal.  ? ? ? ?Review of Systems  ?Constitutional:  Positive for fatigue and  malaise/fatigue.  ?HENT:  Negative for hoarse voice.   ?Respiratory:  Negative for shortness of breath.   ?Musculoskeletal:  Positive for arthritis and stiffness.  ?Neurological:  Negative for tremors.  ?All other systems reviewed and are negative. ? ?   ?Objective:  ? Physical Exam ?Vitals reviewed.  ?Constitutional:   ?   General: She is not in acute distress. ?   Appearance: She is well-developed. She is obese.  ?HENT:  ?   Head: Normocephalic and atraumatic.  ?   Right Ear: Tympanic membrane normal.  ?   Left Ear: Tympanic membrane normal.  ?Eyes:  ?   Pupils: Pupils are equal, round, and reactive to light.  ?Neck:  ?   Thyroid: No thyromegaly.  ?Cardiovascular:  ?   Rate and Rhythm: Normal rate and regular rhythm.  ?   Heart sounds: Normal heart sounds. No murmur heard. ?Pulmonary:  ?   Effort: Pulmonary effort is normal. No respiratory distress.  ?   Breath sounds: Normal breath sounds. No wheezing.  ?Abdominal:  ?   General: Bowel sounds are normal. There is no distension.  ?   Palpations: Abdomen is soft.  ?   Tenderness: There is no abdominal tenderness.  ?Musculoskeletal:     ?   General: No tenderness. Normal range of motion.  ?   Cervical back: Normal range of motion and neck supple.  ?Skin: ?   General: Skin is warm and dry.  ?   Findings: Lesion present.  ?   Comments: Two small cyst like on right bridge of nose  ?Neurological:  ?   Mental Status: She is alert and oriented to person, place, and time.  ?   Cranial  Nerves: No cranial nerve deficit.  ?   Deep Tendon Reflexes: Reflexes are normal and symmetric.  ?Psychiatric:     ?   Behavior: Behavior normal.     ?   Thought Content: Thought content normal.     ?   Judgment: Judgment normal.  ? ? ? ? ?BP 107/72   Pulse 71   Temp 98 ?F (36.7 ?C) (Temporal)   Ht 5' 2" (1.575 m)   Wt 222 lb 9.6 oz (101 kg)   BMI 40.71 kg/m?  ? ?   ?Assessment & Plan:  ?Madeline Clark comes in today with chief complaint of Medical Management of Chronic Issues (Spot on  face she is concerned about been on face for years looks like it has size. ) ? ? ?Diagnosis and orders addressed: ? ?1. Primary osteoarthritis of right hand ?- diclofenac (VOLTAREN) 75 MG EC tablet; Take 1 tablet (75 mg total) by mouth 2 (two) times daily.  Dispense: 60 tablet; Refill: 2 ?- CMP14+EGFR ? ?2. Essential hypertension, benign ?- CMP14+EGFR ? ?3. Hypothyroidism due to acquired atrophy of thyroid ?- CMP14+EGFR ?- TSH ? ?4. Hyperlipidemia with target LDL less than 100 ?- CMP14+EGFR ? ?5. Obesity (BMI 30-39.9) ?- CMP14+EGFR ? ?6. Skin lesion of face ? ?- Ambulatory referral to Dermatology ?- CMP14+EGFR ? ? ?Labs pending ?Health Maintenance reviewed ?Diet and exercise encouraged ? ?Follow up plan: ?3 months  ? ?Evelina Dun, FNP ? ? ? ?

## 2021-11-10 NOTE — Addendum Note (Signed)
Addended by: Ladean Raya on: 11/10/2021 09:27 AM ? ? Modules accepted: Orders ? ?

## 2021-11-11 ENCOUNTER — Other Ambulatory Visit: Payer: Self-pay | Admitting: Family

## 2021-11-11 LAB — CMP14+EGFR
ALT: 13 IU/L (ref 0–32)
AST: 16 IU/L (ref 0–40)
Albumin/Globulin Ratio: 1.9 (ref 1.2–2.2)
Albumin: 4.4 g/dL (ref 3.8–4.8)
Alkaline Phosphatase: 74 IU/L (ref 44–121)
BUN/Creatinine Ratio: 17 (ref 12–28)
BUN: 16 mg/dL (ref 8–27)
Bilirubin Total: 0.4 mg/dL (ref 0.0–1.2)
CO2: 25 mmol/L (ref 20–29)
Calcium: 9.6 mg/dL (ref 8.7–10.3)
Chloride: 104 mmol/L (ref 96–106)
Creatinine, Ser: 0.95 mg/dL (ref 0.57–1.00)
Globulin, Total: 2.3 g/dL (ref 1.5–4.5)
Glucose: 104 mg/dL — ABNORMAL HIGH (ref 70–99)
Potassium: 4.4 mmol/L (ref 3.5–5.2)
Sodium: 142 mmol/L (ref 134–144)
Total Protein: 6.7 g/dL (ref 6.0–8.5)
eGFR: 67 mL/min/{1.73_m2} (ref >59)

## 2021-11-11 LAB — TSH: TSH: 1.84 u[IU]/mL (ref 0.450–4.500)

## 2021-11-18 ENCOUNTER — Telehealth: Payer: Self-pay | Admitting: Family

## 2021-11-18 MED ORDER — LEVOTHYROXINE SODIUM 112 MCG PO TABS: 112.0000 ug | ORAL_TABLET | Freq: Every day | ORAL | 3 refills | Status: DC

## 2021-11-18 MED ORDER — LISINOPRIL 20 MG PO TABS: ORAL_TABLET | ORAL | 3 refills | Status: DC

## 2021-11-18 NOTE — Telephone Encounter (Signed)
Refills sent patient aware. ?

## 2021-11-20 ENCOUNTER — Other Ambulatory Visit: Payer: Self-pay | Admitting: Family

## 2021-11-20 DIAGNOSIS — E785 Hyperlipidemia, unspecified: Secondary | ICD-10-CM

## 2022-02-23 ENCOUNTER — Other Ambulatory Visit: Payer: Self-pay | Admitting: Family

## 2022-02-28 ENCOUNTER — Other Ambulatory Visit: Payer: Self-pay | Admitting: Family

## 2022-02-28 DIAGNOSIS — E785 Hyperlipidemia, unspecified: Secondary | ICD-10-CM

## 2022-03-04 ENCOUNTER — Other Ambulatory Visit: Payer: Self-pay | Admitting: Family

## 2022-03-04 DIAGNOSIS — E785 Hyperlipidemia, unspecified: Secondary | ICD-10-CM

## 2022-05-16 ENCOUNTER — Ambulatory Visit: Payer: BC Managed Care – PPO | Admitting: Family

## 2022-05-16 ENCOUNTER — Encounter: Payer: Self-pay | Admitting: Family

## 2022-05-16 VITALS — BP 113/63 | HR 64 | Temp 98.0°F | Resp 20 | Ht 62.0 in | Wt 222.0 lb

## 2022-05-16 DIAGNOSIS — M19041 Primary osteoarthritis, right hand: Secondary | ICD-10-CM | POA: Diagnosis not present

## 2022-05-16 DIAGNOSIS — I10 Essential (primary) hypertension: Secondary | ICD-10-CM

## 2022-05-16 DIAGNOSIS — Z0000 Encounter for general adult medical examination without abnormal findings: Secondary | ICD-10-CM

## 2022-05-16 DIAGNOSIS — Z23 Encounter for immunization: Secondary | ICD-10-CM

## 2022-05-16 DIAGNOSIS — E034 Atrophy of thyroid (acquired): Secondary | ICD-10-CM

## 2022-05-16 DIAGNOSIS — Z0001 Encounter for general adult medical examination with abnormal findings: Secondary | ICD-10-CM

## 2022-05-16 DIAGNOSIS — E785 Hyperlipidemia, unspecified: Secondary | ICD-10-CM

## 2022-05-16 NOTE — Progress Notes (Signed)
Subjective:    Patient ID: Madeline Clark, female    DOB: Dec 29, 1955, 66 y.o.   MRN: [id number]  Chief Complaint  Patient presents with   Medical Management of Chronic Issues   Pt presents to the office today for CPE without pap and  chronic follow up. She reports she has had a skin lesion on her face for years, but has grown over the last few years. It has become darker and larger. Denies any drainage or pain.  Hypertension This is a chronic problem. The current episode started more than 1 year ago. The problem has been resolved since onset. The problem is controlled. Associated symptoms include shortness of breath ("when I walk a long ways"). Pertinent negatives include no malaise/fatigue or peripheral edema. Risk factors for coronary artery disease include dyslipidemia and obesity. The current treatment provides moderate improvement. Identifiable causes of hypertension include a thyroid problem.  Thyroid Problem Presents for follow-up visit. Symptoms include hair loss. Patient reports no cold intolerance, constipation, fatigue or leg swelling. The symptoms have been stable. Her past medical history is significant for hyperlipidemia.  Arthritis Presents for follow-up visit. She complains of pain and stiffness. Affected locations include the right PIP. Her pain is at a severity of 2/10. Pertinent negatives include no fatigue.  Hyperlipidemia This is a chronic problem. The current episode started more than 1 year ago. The problem is controlled. Recent lipid tests were reviewed and are normal. Exacerbating diseases include hypothyroidism. Associated symptoms include shortness of breath ("when I walk a long ways"). Current antihyperlipidemic treatment includes statins. The current treatment provides moderate improvement of lipids. Risk factors for coronary artery disease include dyslipidemia, hypertension and a sedentary lifestyle.      Review of Systems  Constitutional:  Negative for fatigue  and malaise/fatigue.  Respiratory:  Positive for shortness of breath ("when I walk a long ways").   Gastrointestinal:  Negative for constipation.  Endocrine: Negative for cold intolerance.  Musculoskeletal:  Positive for arthritis and stiffness.  All other systems reviewed and are negative.  Family History  Problem Relation Age of Onset   Diabetes Mother    Heart disease Father    Cancer Father        skin, kidney   Cancer Sister 69       carcinoid tumor of ileum   Colon cancer Neg Hx    Esophageal cancer Neg Hx    Pancreatic cancer Neg Hx    Rectal cancer Neg Hx    Stomach cancer Neg Hx    Social History   Socioeconomic History   Marital status: Unknown    Spouse name: Not on file   Number of children: Not on file   Years of education: Not on file   Highest education level: Not on file  Occupational History    Employer: [employer]: retired  Tobacco Use   Smoking status: Never   Smokeless tobacco: Never  Vaping Use   Vaping Use: Never used  Substance and Sexual Activity   Alcohol use: No   Drug use: No   Sexual activity: Not on file  Other Topics Concern   Not on file  Social History Narrative   Not on file   Social Determinants of Health   Financial Resource Strain: Not on file  Food Insecurity: Not on file  Transportation Needs: Not on file  Physical Activity: Not on file  Stress: Not on file  Social Connections: Not on file  Objective:   Physical Exam Vitals reviewed.  Constitutional:      General: She is not in acute distress.    Appearance: She is well-developed. She is obese.  HENT:     Head: Normocephalic and atraumatic.     Right Ear: Tympanic membrane normal.     Left Ear: Tympanic membrane normal.  Eyes:     Pupils: Pupils are equal, round, and reactive to light.  Neck:     Thyroid: No thyromegaly.  Cardiovascular:     Rate and Rhythm: Normal rate and regular rhythm.     Heart sounds: Normal heart  sounds. No murmur heard. Pulmonary:     Effort: Pulmonary effort is normal. No respiratory distress.     Breath sounds: Normal breath sounds. No wheezing.  Abdominal:     General: Bowel sounds are normal. There is no distension.     Palpations: Abdomen is soft.     Tenderness: There is no abdominal tenderness.  Musculoskeletal:        General: No tenderness. Normal range of motion.     Cervical back: Normal range of motion and neck supple.  Skin:    General: Skin is warm and dry.  Neurological:     Mental Status: She is alert and oriented to person, place, and time.     Cranial Nerves: No cranial nerve deficit.     Deep Tendon Reflexes: Reflexes are normal and symmetric.  Psychiatric:        Behavior: Behavior normal.        Thought Content: Thought content normal.        Judgment: Judgment normal.       BP 113/63   Pulse 64   Temp 98 F (36.7 C) (Oral)   Resp 20   Ht 5' 2"  (1.575 m)   Wt 222 lb (100.7 kg)   SpO2 98%   BMI 40.60 kg/m      Assessment & Plan:  Madeline Clark comes in today with chief complaint of Medical Management of Chronic Issues   Diagnosis and orders addressed:  1. Essential hypertension, benign - CMP14+EGFR - CBC with Differential/Platelet  2. Hypothyroidism due to acquired atrophy of thyroid - CMP14+EGFR - CBC with Differential/Platelet  3. Primary osteoarthritis of right hand - CMP14+EGFR - CBC with Differential/Platelet  4. Morbid obesity ([facility])  - CMP14+EGFR - CBC with Differential/Platelet  5. Hyperlipidemia with target LDL less than 100 - CMP14+EGFR - CBC with Differential/Platelet  6. Annual physical exam - CMP14+EGFR - CBC with Differential/Platelet - Lipid panel - TSH   Labs pending Health Maintenance reviewed Diet and exercise encouraged  Follow up plan: 6 months    Evelina Dun, FNP

## 2022-05-16 NOTE — Patient Instructions (Signed)

## 2022-05-17 LAB — LIPID PANEL
Chol/HDL Ratio: 2.5 ratio (ref 0.0–4.4)
Cholesterol, Total: 126 mg/dL (ref 100–199)
HDL: 51 mg/dL (ref >39)
LDL Chol Calc (NIH): 56 mg/dL (ref 0–99)
Triglycerides: 104 mg/dL (ref 0–149)
VLDL Cholesterol Cal: 19 mg/dL (ref 5–40)

## 2022-05-17 LAB — CMP14+EGFR
ALT: 16 IU/L (ref 0–32)
AST: 15 IU/L (ref 0–40)
Albumin/Globulin Ratio: 1.8 (ref 1.2–2.2)
Albumin: 4.5 g/dL (ref 3.9–4.9)
Alkaline Phosphatase: 77 IU/L (ref 44–121)
BUN/Creatinine Ratio: 14 (ref 12–28)
BUN: 16 mg/dL (ref 8–27)
Bilirubin Total: 0.7 mg/dL (ref 0.0–1.2)
CO2: 28 mmol/L (ref 20–29)
Calcium: 10.1 mg/dL (ref 8.7–10.3)
Chloride: 103 mmol/L (ref 96–106)
Creatinine, Ser: 1.12 mg/dL — ABNORMAL HIGH (ref 0.57–1.00)
Globulin, Total: 2.5 g/dL (ref 1.5–4.5)
Glucose: 102 mg/dL — ABNORMAL HIGH (ref 70–99)
Potassium: 5.1 mmol/L (ref 3.5–5.2)
Sodium: 143 mmol/L (ref 134–144)
Total Protein: 7 g/dL (ref 6.0–8.5)
eGFR: 55 mL/min/{1.73_m2} — ABNORMAL LOW (ref >59)

## 2022-05-17 LAB — CBC WITH DIFFERENTIAL/PLATELET
Basophils Absolute: 0.1 10*3/uL (ref 0.0–0.2)
Basos: 2 %
EOS (ABSOLUTE): 0.3 10*3/uL (ref 0.0–0.4)
Eos: 5 %
Hematocrit: 44.8 % (ref 34.0–46.6)
Hemoglobin: 14.6 g/dL (ref 11.1–15.9)
Immature Grans (Abs): 0.1 10*3/uL (ref 0.0–0.1)
Immature Granulocytes: 1 %
Lymphocytes Absolute: 1.8 10*3/uL (ref 0.7–3.1)
Lymphs: 27 %
MCH: 29.6 pg (ref 26.6–33.0)
MCHC: 32.6 g/dL (ref 31.5–35.7)
MCV: 91 fL (ref 79–97)
Monocytes Absolute: 0.5 10*3/uL (ref 0.1–0.9)
Monocytes: 7 %
Neutrophils Absolute: 3.9 10*3/uL (ref 1.4–7.0)
Neutrophils: 58 %
Platelets: 260 10*3/uL (ref 150–450)
RBC: 4.94 x10E6/uL (ref 3.77–5.28)
RDW: 12.1 % (ref 11.7–15.4)
WBC: 6.7 10*3/uL (ref 3.4–10.8)

## 2022-05-17 LAB — TSH: TSH: 0.759 u[IU]/mL (ref 0.450–4.500)

## 2022-06-13 ENCOUNTER — Other Ambulatory Visit: Payer: Self-pay | Admitting: Family

## 2022-06-13 DIAGNOSIS — E785 Hyperlipidemia, unspecified: Secondary | ICD-10-CM

## 2022-09-27 ENCOUNTER — Other Ambulatory Visit: Payer: Self-pay | Admitting: Family

## 2022-09-27 DIAGNOSIS — E785 Hyperlipidemia, unspecified: Secondary | ICD-10-CM

## 2022-11-04 ENCOUNTER — Telehealth: Payer: Self-pay | Admitting: Family

## 2022-11-04 DIAGNOSIS — E785 Hyperlipidemia, unspecified: Secondary | ICD-10-CM

## 2022-11-04 MED ORDER — SIMVASTATIN 40 MG PO TABS: 40.0000 mg | ORAL_TABLET | Freq: Every day | ORAL | 0 refills | Status: DC

## 2022-11-04 NOTE — Telephone Encounter (Signed)
Patient aware and verbalized understanding. REFILL SENT  IN

## 2022-11-04 NOTE — Telephone Encounter (Signed)
Patient has appointment scheduled for 11/14/22 and will need medication refilled forsimvastatin (ZOCOR) 40 MG tablet  until his appointment date.  Please advise. The [facility]

## 2022-11-11 NOTE — Patient Instructions (Signed)
Our records indicate that you are due for your annual mammogram/breast imaging. While there is no way to prevent breast cancer, early detection provides the best opportunity for curing it. For women over the age of 40, the American Cancer Society recommends a yearly clinical breast exam and a yearly mammogram. These practices have saved thousands of lives. We need your help to ensure that you are receiving optimal medical care. Please call the imaging location that has done you previous mammograms. Please remember to list us as your primary care. This helps make sure we receive a report and can update your chart.  Below is the contact information for several local breast imaging centers. You may call the location that works best for you, and they will be happy to assistance in making you an appointment. You do not need an order for a regular screening mammogram. However, if you are having any problems or concerns with you breast area, please let your primary care provider know, and appropriate orders will be placed. Please let our office know if you have any questions or concerns. Or if you need information for another imaging center not on this list or outside of the area. We are commented to working with you on your health care journey.   The mobile unit/bus ([facility]) - they come twice a month to our location.  These appointments can be made through our office or by call The [facility]  [facility]  [address] [place], [place] [address] Phone [phone]  [facility]  [address]  [place], [place] [address] [phone]  [facility])  [address] [place], [place] [address] [phone]  [facility] [place]  [address] [place] [address] [phone]  [facility]  [address], [address] [place] [address] [phone]  [facility] [place]  [address] [place], [place] [address] [phone]  [facility] [address] [place],  [address] [phone]  [facility] [address]  [address] [place],  [address] [phone]  [facility] [address] [place] [address] [phone]     

## 2022-11-14 ENCOUNTER — Ambulatory Visit: Payer: BC Managed Care – PPO | Admitting: Family

## 2022-11-14 ENCOUNTER — Encounter: Payer: Self-pay | Admitting: Family

## 2022-11-14 VITALS — BP 114/71 | HR 67 | Temp 97.8°F | Ht 62.0 in | Wt 222.2 lb

## 2022-11-14 DIAGNOSIS — E034 Atrophy of thyroid (acquired): Secondary | ICD-10-CM | POA: Diagnosis not present

## 2022-11-14 DIAGNOSIS — I10 Essential (primary) hypertension: Secondary | ICD-10-CM | POA: Diagnosis not present

## 2022-11-14 DIAGNOSIS — E785 Hyperlipidemia, unspecified: Secondary | ICD-10-CM

## 2022-11-14 DIAGNOSIS — M19041 Primary osteoarthritis, right hand: Secondary | ICD-10-CM | POA: Diagnosis not present

## 2022-11-14 MED ORDER — DICLOFENAC SODIUM 75 MG PO TBEC: 75.0000 mg | DELAYED_RELEASE_TABLET | Freq: Two times a day (BID) | ORAL | 2 refills | Status: DC

## 2022-11-14 MED ORDER — SIMVASTATIN 40 MG PO TABS: 40.0000 mg | ORAL_TABLET | Freq: Every day | ORAL | 2 refills | Status: DC

## 2022-11-14 MED ORDER — HYDROCHLOROTHIAZIDE 25 MG PO TABS: ORAL_TABLET | ORAL | 2 refills | Status: DC

## 2022-11-14 MED ORDER — LEVOTHYROXINE SODIUM 112 MCG PO TABS: 112.0000 ug | ORAL_TABLET | Freq: Every day | ORAL | 3 refills | Status: DC

## 2022-11-14 MED ORDER — LISINOPRIL 20 MG PO TABS: ORAL_TABLET | ORAL | 3 refills | Status: DC

## 2022-11-14 NOTE — Progress Notes (Signed)
Subjective:    Patient ID: Madeline Clark, female    DOB: 1956-05-04, 67 y.o.   MRN: [id number]  Chief Complaint  Patient presents with   Medical Management of Chronic Issues   Pt presents to the office today for  chronic follow up. She is followed by Dermatologists annually for skin cancer.  Hypertension This is a chronic problem. The current episode started more than 1 year ago. The problem has been resolved since onset. The problem is controlled. Pertinent negatives include no malaise/fatigue, peripheral edema or shortness of breath. Risk factors for coronary artery disease include dyslipidemia, obesity and sedentary lifestyle. The current treatment provides moderate improvement. Identifiable causes of hypertension include a thyroid problem.  Thyroid Problem Presents for follow-up visit. Symptoms include dry skin. Patient reports no anxiety, fatigue, hair loss or hoarse voice. The symptoms have been stable. Her past medical history is significant for hyperlipidemia.  Arthritis Presents for follow-up visit. She complains of pain and stiffness. The symptoms have been stable. Affected locations include the left MCP, right MCP and left knee. Her pain is at a severity of 2/10. Pertinent negatives include no fatigue.  Hyperlipidemia This is a chronic problem. The current episode started more than 1 year ago. The problem is controlled. Exacerbating diseases include obesity. Pertinent negatives include no shortness of breath. Current antihyperlipidemic treatment includes statins. The current treatment provides moderate improvement of lipids. Risk factors for coronary artery disease include dyslipidemia, hypertension, a sedentary lifestyle and post-menopausal.      Review of Systems  Constitutional:  Negative for fatigue and malaise/fatigue.  HENT:  Negative for hoarse voice.   Respiratory:  Negative for shortness of breath.   Musculoskeletal:  Positive for arthritis and stiffness.   Psychiatric/Behavioral:  The patient is not nervous/anxious.   All other systems reviewed and are negative.      Objective:   Physical Exam Vitals reviewed.  Constitutional:      General: She is not in acute distress.    Appearance: She is well-developed. She is obese.  HENT:     Head: Normocephalic and atraumatic.     Right Ear: Tympanic membrane normal.     Left Ear: Tympanic membrane normal.  Eyes:     Pupils: Pupils are equal, round, and reactive to light.  Neck:     Thyroid: No thyromegaly.  Cardiovascular:     Rate and Rhythm: Normal rate and regular rhythm.     Heart sounds: Normal heart sounds. No murmur heard. Pulmonary:     Effort: Pulmonary effort is normal. No respiratory distress.     Breath sounds: Normal breath sounds. No wheezing.  Abdominal:     General: Bowel sounds are normal. There is no distension.     Palpations: Abdomen is soft.     Tenderness: There is no abdominal tenderness.  Musculoskeletal:        General: No tenderness. Normal range of motion.     Cervical back: Normal range of motion and neck supple.  Skin:    General: Skin is warm and dry.  Neurological:     Mental Status: She is alert and oriented to person, place, and time.     Cranial Nerves: No cranial nerve deficit.     Deep Tendon Reflexes: Reflexes are normal and symmetric.  Psychiatric:        Behavior: Behavior normal.        Thought Content: Thought content normal.        Judgment: Judgment normal.  BP 114/71   Pulse 67   Temp 97.8 F (36.6 C) (Temporal)   Ht '5\' 2"'$  (1.575 m)   Wt 222 lb 3.2 oz (100.8 kg)   SpO2 98%   BMI 40.64 kg/m      Assessment & Plan:  Madeline Clark comes in today with chief complaint of Medical Management of Chronic Issues   Diagnosis and orders addressed:  1. Essential hypertension, benign - hydrochlorothiazide (HYDRODIURIL) 25 MG tablet; TAKE ONE (1) TABLET EACH DAY  Dispense: 90 tablet; Refill: 2 - lisinopril (ZESTRIL) 20 MG  tablet; TAKE ONE (1) TABLET EACH DAY  Dispense: 90 tablet; Refill: 3 - CMP14+EGFR  2. Hyperlipidemia with target LDL less than 100 - simvastatin (ZOCOR) 40 MG tablet; Take 1 tablet (40 mg total) by mouth at bedtime.  Dispense: 90 tablet; Refill: 2 - CMP14+EGFR  3. Primary osteoarthritis of right hand - diclofenac (VOLTAREN) 75 MG EC tablet; Take 1 tablet (75 mg total) by mouth 2 (two) times daily.  Dispense: 180 tablet; Refill: 2 - CMP14+EGFR  4. Hypothyroidism due to acquired atrophy of thyroid - levothyroxine (SYNTHROID) 112 MCG tablet; Take 1 tablet (112 mcg total) by mouth daily.  Dispense: 90 tablet; Refill: 3 - CMP14+EGFR - TSH  5. Morbid obesity ([place]) - CMP14+EGFR   Labs pending Health Maintenance reviewed Diet and exercise encouraged  Follow up plan: 6 months    Evelina Dun, FNP

## 2022-11-15 ENCOUNTER — Other Ambulatory Visit: Payer: Self-pay | Admitting: Family

## 2022-11-15 LAB — CMP14+EGFR
ALT: 11 IU/L (ref 0–32)
AST: 18 IU/L (ref 0–40)
Albumin/Globulin Ratio: 2.2 (ref 1.2–2.2)
Albumin: 4.4 g/dL (ref 3.9–4.9)
Alkaline Phosphatase: 75 IU/L (ref 44–121)
BUN/Creatinine Ratio: 12 (ref 12–28)
BUN: 13 mg/dL (ref 8–27)
Bilirubin Total: 0.5 mg/dL (ref 0.0–1.2)
CO2: 23 mmol/L (ref 20–29)
Calcium: 10.2 mg/dL (ref 8.7–10.3)
Chloride: 103 mmol/L (ref 96–106)
Creatinine, Ser: 1.13 mg/dL — ABNORMAL HIGH (ref 0.57–1.00)
Globulin, Total: 2 g/dL (ref 1.5–4.5)
Glucose: 100 mg/dL — ABNORMAL HIGH (ref 70–99)
Potassium: 4.4 mmol/L (ref 3.5–5.2)
Sodium: 142 mmol/L (ref 134–144)
Total Protein: 6.4 g/dL (ref 6.0–8.5)
eGFR: 54 mL/min/{1.73_m2} — ABNORMAL LOW (ref >59)

## 2022-11-15 LAB — TSH: TSH: 15.9 u[IU]/mL — ABNORMAL HIGH (ref 0.450–4.500)

## 2022-11-15 MED ORDER — LEVOTHYROXINE SODIUM 125 MCG PO TABS: 125.0000 ug | ORAL_TABLET | Freq: Every day | ORAL | 1 refills | Status: DC

## 2023-01-16 ENCOUNTER — Encounter: Payer: Self-pay | Admitting: Family

## 2023-01-16 ENCOUNTER — Ambulatory Visit: Payer: BC Managed Care – PPO | Admitting: Family

## 2023-01-16 VITALS — BP 118/68 | HR 60 | Temp 97.4°F | Ht 62.0 in | Wt 222.0 lb

## 2023-01-16 DIAGNOSIS — E034 Atrophy of thyroid (acquired): Secondary | ICD-10-CM

## 2023-01-16 DIAGNOSIS — I10 Essential (primary) hypertension: Secondary | ICD-10-CM

## 2023-01-16 NOTE — Addendum Note (Signed)
Addended by: Jannifer Rodney A on: 01/16/2023 09:32 AM   Modules accepted: Level of Service

## 2023-01-16 NOTE — Patient Instructions (Signed)

## 2023-01-16 NOTE — Progress Notes (Signed)
Subjective:    Patient ID: Madeline Clark, female    DOB: 09-Nov-1955, 67 y.o.   MRN: [id number]  Chief Complaint  Patient presents with   Hypothyroidism    Just started snythroid about 3 weeks ago    Pt presents to the office today to follow up on hypothyroid. She was seen on 11/14/22 and had an elevated TSH. We increased her Levothyroxine to 125 mcg. However, she just started her new dose three weeks ago.   Thyroid Problem Presents for follow-up visit. Symptoms include dry skin. Patient reports no anxiety, constipation, diarrhea or fatigue. The symptoms have been stable.  Hypertension This is a chronic problem. The current episode started more than 1 year ago. The problem has been resolved since onset. The problem is controlled. Pertinent negatives include no malaise/fatigue, peripheral edema or shortness of breath. Risk factors for coronary artery disease include obesity. The current treatment provides mild improvement. Identifiable causes of hypertension include a thyroid problem.      Review of Systems  Constitutional:  Negative for fatigue and malaise/fatigue.  Respiratory:  Negative for shortness of breath.   Gastrointestinal:  Negative for constipation and diarrhea.  Psychiatric/Behavioral:  The patient is not nervous/anxious.   All other systems reviewed and are negative.      Objective:   Physical Exam Vitals reviewed.  Constitutional:      General: She is not in acute distress.    Appearance: She is well-developed. She is obese.  HENT:     Head: Normocephalic and atraumatic.     Right Ear: Tympanic membrane normal.     Left Ear: Tympanic membrane normal.  Eyes:     Pupils: Pupils are equal, round, and reactive to light.  Neck:     Thyroid: No thyromegaly.  Cardiovascular:     Rate and Rhythm: Normal rate and regular rhythm.     Heart sounds: Normal heart sounds. No murmur heard. Pulmonary:     Effort: Pulmonary effort is normal. No respiratory distress.      Breath sounds: Normal breath sounds. No wheezing.  Abdominal:     General: Bowel sounds are normal. There is no distension.     Palpations: Abdomen is soft.     Tenderness: There is no abdominal tenderness.  Musculoskeletal:        General: No tenderness. Normal range of motion.     Cervical back: Normal range of motion and neck supple.  Skin:    General: Skin is warm and dry.  Neurological:     Mental Status: She is alert and oriented to person, place, and time.     Cranial Nerves: No cranial nerve deficit.     Deep Tendon Reflexes: Reflexes are normal and symmetric.  Psychiatric:        Behavior: Behavior normal.        Thought Content: Thought content normal.        Judgment: Judgment normal.       BP 118/68   Pulse 60   Temp (!) 97.4 F (36.3 C) (Temporal)   Ht 5\' 2"  (1.575 m)   Wt 222 lb (100.7 kg)   BMI 40.60 kg/m      Assessment & Plan:  Madeline Clark comes in today with chief complaint of Hypothyroidism (Just started snythroid about 3 weeks ago )   Diagnosis and orders addressed:  1. Hypothyroidism due to acquired atrophy of thyroid - TSH; Future  2. Morbid obesity (HCC)  3. Essential hypertension, benign  Labs pending Health Maintenance reviewed Diet and exercise encouraged  Follow up plan: 4 months   Jannifer Rodney, FNP

## 2023-02-16 ENCOUNTER — Other Ambulatory Visit: Payer: Self-pay | Admitting: Family

## 2023-05-03 ENCOUNTER — Other Ambulatory Visit: Payer: Medicare PPO

## 2023-05-03 DIAGNOSIS — E034 Atrophy of thyroid (acquired): Secondary | ICD-10-CM

## 2023-05-04 ENCOUNTER — Other Ambulatory Visit: Payer: Self-pay | Admitting: Family

## 2023-05-04 LAB — TSH: TSH: 2 u[IU]/mL (ref 0.450–4.500)

## 2023-05-04 MED ORDER — LEVOTHYROXINE SODIUM 125 MCG PO TABS: 125.0000 ug | ORAL_TABLET | Freq: Every day | ORAL | 3 refills | Status: DC

## 2023-05-18 ENCOUNTER — Ambulatory Visit: Payer: BC Managed Care – PPO | Admitting: Family

## 2023-05-18 ENCOUNTER — Other Ambulatory Visit: Payer: Medicare PPO

## 2023-08-22 ENCOUNTER — Ambulatory Visit: Payer: Medicare PPO | Admitting: Family

## 2023-08-25 ENCOUNTER — Other Ambulatory Visit: Payer: Self-pay | Admitting: Family

## 2023-08-25 DIAGNOSIS — I10 Essential (primary) hypertension: Secondary | ICD-10-CM

## 2023-08-28 ENCOUNTER — Ambulatory Visit: Payer: Medicare PPO | Admitting: Family

## 2023-08-28 ENCOUNTER — Encounter: Payer: Self-pay | Admitting: Family

## 2023-08-28 VITALS — BP 110/71 | HR 59 | Temp 97.2°F | Ht 62.0 in | Wt 217.4 lb

## 2023-08-28 DIAGNOSIS — E785 Hyperlipidemia, unspecified: Secondary | ICD-10-CM | POA: Diagnosis not present

## 2023-08-28 DIAGNOSIS — I10 Essential (primary) hypertension: Secondary | ICD-10-CM

## 2023-08-28 DIAGNOSIS — E034 Atrophy of thyroid (acquired): Secondary | ICD-10-CM

## 2023-08-28 DIAGNOSIS — Z0000 Encounter for general adult medical examination without abnormal findings: Secondary | ICD-10-CM

## 2023-08-28 DIAGNOSIS — Z0001 Encounter for general adult medical examination with abnormal findings: Secondary | ICD-10-CM

## 2023-08-28 DIAGNOSIS — M19041 Primary osteoarthritis, right hand: Secondary | ICD-10-CM

## 2023-08-28 MED ORDER — DICLOFENAC SODIUM 75 MG PO TBEC: 75.0000 mg | DELAYED_RELEASE_TABLET | Freq: Two times a day (BID) | ORAL | 2 refills | Status: DC

## 2023-08-28 MED ORDER — SIMVASTATIN 40 MG PO TABS: 40.0000 mg | ORAL_TABLET | Freq: Every day | ORAL | 2 refills | Status: DC

## 2023-08-28 MED ORDER — HYDROCHLOROTHIAZIDE 25 MG PO TABS: ORAL_TABLET | ORAL | 2 refills | Status: DC

## 2023-08-28 MED ORDER — BLOOD PRESSURE KIT: 1.0000 | PACK | Freq: Two times a day (BID) | 0 refills | Status: AC

## 2023-08-28 NOTE — Progress Notes (Signed)
Subjective:    Patient ID: Madeline Clark, female    DOB: 1955/09/27, 67 y.o.   MRN: [id number]  Chief Complaint  Patient presents with   Medical Management of Chronic Issues    6 month follow up    Pt presents to the office today for CPE and chronic follow up. She is followed by Dermatologists annually for skin cancer.   She is morbid obese with a BMI of 39 with HTN and hyperlipidemia.   She is complaining of hair loss since starting lisinopril. Would like to change medication.  Thyroid Problem Presents for follow-up visit. Symptoms include dry skin. Patient reports no constipation, depressed mood, fatigue or hoarse voice. The symptoms have been stable. Her past medical history is significant for hyperlipidemia.  Hypertension This is a chronic problem. The current episode started more than 1 year ago. The problem has been resolved since onset. Associated symptoms include shortness of breath ("if I walk a whole lot"). Pertinent negatives include no malaise/fatigue or peripheral edema. Risk factors for coronary artery disease include dyslipidemia, obesity and sedentary lifestyle. The current treatment provides moderate improvement. Identifiable causes of hypertension include a thyroid problem.  Hyperlipidemia This is a chronic problem. The current episode started more than 1 year ago. Exacerbating diseases include obesity. Associated symptoms include shortness of breath ("if I walk a whole lot"). Current antihyperlipidemic treatment includes statins. The current treatment provides mild improvement of lipids. Risk factors for coronary artery disease include dyslipidemia, hypertension, a sedentary lifestyle and post-menopausal.  Arthritis Presents for follow-up visit. She complains of pain and stiffness. Affected locations include the left MCP and right MCP. Her pain is at a severity of 5/10. Pertinent negatives include no fatigue.      Review of Systems  Constitutional:  Negative for  fatigue and malaise/fatigue.  HENT:  Negative for hoarse voice.   Respiratory:  Positive for shortness of breath ("if I walk a whole lot").   Gastrointestinal:  Negative for constipation.  Musculoskeletal:  Positive for arthritis and stiffness.  All other systems reviewed and are negative.   Social History   Socioeconomic History   Marital status: Unknown    Spouse name: Not on file   Number of children: Not on file   Years of education: Not on file   Highest education level: Not on file  Occupational History    Employer: [employer]    Comment: retired  Tobacco Use   Smoking status: Never   Smokeless tobacco: Never  Vaping Use   Vaping status: Never Used  Substance and Sexual Activity   Alcohol use: No   Drug use: No   Sexual activity: Not on file  Other Topics Concern   Not on file  Social History Narrative   Not on file   Social Drivers of Health   Financial Resource Strain: Not on file  Food Insecurity: Not on file  Transportation Needs: Not on file  Physical Activity: Not on file  Stress: Not on file  Social Connections: Not on file   Family History  Problem Relation Age of Onset   Diabetes Mother    Heart disease Father    Cancer Father        skin, kidney   Cancer Sister 38       carcinoid tumor of ileum   Colon cancer Neg Hx    Esophageal cancer Neg Hx    Pancreatic cancer Neg Hx    Rectal cancer Neg Hx    Stomach  cancer Neg Hx         Objective:   Physical Exam Vitals reviewed.  Constitutional:      General: She is not in acute distress.    Appearance: She is well-developed. She is obese.  HENT:     Head: Normocephalic and atraumatic.     Right Ear: Tympanic membrane normal.     Left Ear: Tympanic membrane normal.  Eyes:     Pupils: Pupils are equal, round, and reactive to light.  Neck:     Thyroid: No thyromegaly.  Cardiovascular:     Rate and Rhythm: Normal rate and regular rhythm.     Heart sounds: Normal heart  sounds. No murmur heard. Pulmonary:     Effort: Pulmonary effort is normal. No respiratory distress.     Breath sounds: Normal breath sounds. No wheezing.  Abdominal:     General: Bowel sounds are normal. There is no distension.     Palpations: Abdomen is soft.     Tenderness: There is no abdominal tenderness.  Musculoskeletal:        General: No tenderness. Normal range of motion.     Cervical back: Normal range of motion and neck supple.  Skin:    General: Skin is warm and dry.  Neurological:     Mental Status: She is alert and oriented to person, place, and time.     Cranial Nerves: No cranial nerve deficit.     Deep Tendon Reflexes: Reflexes are normal and symmetric.  Psychiatric:        Behavior: Behavior normal.        Thought Content: Thought content normal.        Judgment: Judgment normal.       BP 110/71   Pulse (!) 59   Temp (!) 97.2 F (36.2 C)   Ht 5\' 2"  (1.575 m)   Wt 217 lb 6.4 oz (98.6 kg)   SpO2 98%   BMI 39.76 kg/m      Assessment & Plan:  Madeline Clark comes in today with chief complaint of Medical Management of Chronic Issues (6 month follow up )   Diagnosis and orders addressed:  1. Hypothyroidism due to acquired atrophy of thyroid - CMP14+EGFR - TSH  2. Essential hypertension, benign Pt reports hair loss since starting lisinopril. BP is at goal today and wants to stop lisinopril and not add another medication at this time.  Pt will monitor BP at home and report >140/90 - CMP14+EGFR - hydrochlorothiazide (HYDRODIURIL) 25 MG tablet; TAKE ONE (1) TABLET EACH DAY  Dispense: 90 tablet; Refill: 2 - Blood Pressure KIT; 1 Device by Does not apply route 2 (two) times daily.  Dispense: 1 kit; Refill: 0  3. Hyperlipidemia with target LDL less than 100 - CMP14+EGFR - Lipid panel - simvastatin (ZOCOR) 40 MG tablet; Take 1 tablet (40 mg total) by mouth at bedtime.  Dispense: 90 tablet; Refill: 2  4. Morbid obesity (HCC) - CMP14+EGFR  5. Primary  osteoarthritis of right hand - CMP14+EGFR - diclofenac (VOLTAREN) 75 MG EC tablet; Take 1 tablet (75 mg total) by mouth 2 (two) times daily.  Dispense: 180 tablet; Refill: 2  6. Annual physical exam (Primary) - CMP14+EGFR - Lipid panel - TSH - CBC with Differential/Platelet   Labs pending Continue current medications  Keep follow up with specialists  Health Maintenance reviewed Diet and exercise encouraged  Return in about 3 months (around 11/26/2023), or if symptoms worsen or fail to improve.  Jannifer Rodney, FNP

## 2023-08-28 NOTE — Patient Instructions (Signed)
Hypertension, Adult High blood pressure (hypertension) is when the force of blood pumping through the arteries is too strong. The arteries are the blood vessels that carry blood from the heart throughout the body. Hypertension forces the heart to work harder to pump blood and may cause arteries to become narrow or stiff. Untreated or uncontrolled hypertension can lead to a heart attack, heart failure, a stroke, kidney disease, and other problems. A blood pressure reading consists of a higher number over a lower number. Ideally, your blood pressure should be below 120/80. The first ("top") number is called the systolic pressure. It is a measure of the pressure in your arteries as your heart beats. The second ("bottom") number is called the diastolic pressure. It is a measure of the pressure in your arteries as the heart relaxes. What are the causes? The exact cause of this condition is not known. There are some conditions that result in high blood pressure. What increases the risk? Certain factors may make you more likely to develop high blood pressure. Some of these risk factors are under your control, including: Smoking. Not getting enough exercise or physical activity. Being overweight. Having too much fat, sugar, calories, or salt (sodium) in your diet. Drinking too much alcohol. Other risk factors include: Having a personal history of heart disease, diabetes, high cholesterol, or kidney disease. Stress. Having a family history of high blood pressure and high cholesterol. Having obstructive sleep apnea. Age. The risk increases with age. What are the signs or symptoms? High blood pressure may not cause symptoms. Very high blood pressure (hypertensive crisis) may cause: Headache. Fast or irregular heartbeats (palpitations). Shortness of breath. Nosebleed. Nausea and vomiting. Vision changes. Severe chest pain, dizziness, and seizures. How is this diagnosed? This condition is diagnosed by  measuring your blood pressure while you are seated, with your arm resting on a flat surface, your legs uncrossed, and your feet flat on the floor. The cuff of the blood pressure monitor will be placed directly against the skin of your upper arm at the level of your heart. Blood pressure should be measured at least twice using the same arm. Certain conditions can cause a difference in blood pressure between your right and left arms. If you have a high blood pressure reading during one visit or you have normal blood pressure with other risk factors, you may be asked to: Return on a different day to have your blood pressure checked again. Monitor your blood pressure at home for 1 week or longer. If you are diagnosed with hypertension, you may have other blood or imaging tests to help your health care provider understand your overall risk for other conditions. How is this treated? This condition is treated by making healthy lifestyle changes, such as eating healthy foods, exercising more, and reducing your alcohol intake. You may be referred for counseling on a healthy diet and physical activity. Your health care provider may prescribe medicine if lifestyle changes are not enough to get your blood pressure under control and if: Your systolic blood pressure is above 130. Your diastolic blood pressure is above 80. Your personal target blood pressure may vary depending on your medical conditions, your age, and other factors. Follow these instructions at home: Eating and drinking  Eat a diet that is high in fiber and potassium, and low in sodium, added sugar, and fat. An example of this eating plan is called the DASH diet. DASH stands for Dietary Approaches to Stop Hypertension. To eat this way: Eat   plenty of fresh fruits and vegetables. Try to fill one half of your plate at each meal with fruits and vegetables. Eat whole grains, such as whole-wheat pasta, brown rice, or whole-grain bread. Fill about one  fourth of your plate with whole grains. Eat or drink low-fat dairy products, such as skim milk or low-fat yogurt. Avoid fatty cuts of meat, processed or cured meats, and poultry with skin. Fill about one fourth of your plate with lean proteins, such as fish, chicken without skin, beans, eggs, or tofu. Avoid pre-made and processed foods. These tend to be higher in sodium, added sugar, and fat. Reduce your daily sodium intake. Many people with hypertension should eat less than 1,500 mg of sodium a day. Do not drink alcohol if: Your health care provider tells you not to drink. You are pregnant, may be pregnant, or are planning to become pregnant. If you drink alcohol: Limit how much you have to: 0-1 drink a day for women. 0-2 drinks a day for men. Know how much alcohol is in your drink. In the [place] one drink equals one 12 oz bottle of beer (355 mL), one 5 oz glass of wine (148 mL), or one 1 oz glass of hard liquor (44 mL). Lifestyle  Work with your health care provider to maintain a healthy body weight or to lose weight. Ask what an ideal weight is for you. Get at least 30 minutes of exercise that causes your heart to beat faster (aerobic exercise) most days of the week. Activities may include walking, swimming, or biking. Include exercise to strengthen your muscles (resistance exercise), such as Pilates or lifting weights, as part of your weekly exercise routine. Try to do these types of exercises for 30 minutes at least 3 days a week. Do not use any products that contain nicotine or tobacco. These products include cigarettes, chewing tobacco, and vaping devices, such as e-cigarettes. If you need help quitting, ask your health care provider. Monitor your blood pressure at home as told by your health care provider. Keep all follow-up visits. This is important. Medicines Take over-the-counter and prescription medicines only as told by your health care provider. Follow directions carefully. Blood  pressure medicines must be taken as prescribed. Do not skip doses of blood pressure medicine. Doing this puts you at risk for problems and can make the medicine less effective. Ask your health care provider about side effects or reactions to medicines that you should watch for. Contact a health care provider if you: Think you are having a reaction to a medicine you are taking. Have headaches that keep coming back (recurring). Feel dizzy. Have swelling in your ankles. Have trouble with your vision. Get help right away if you: Develop a severe headache or confusion. Have unusual weakness or numbness. Feel faint. Have severe pain in your chest or abdomen. Vomit repeatedly. Have trouble breathing. These symptoms may be an emergency. Get help right away. Call 911. Do not wait to see if the symptoms will go away. Do not drive yourself to the hospital. Summary Hypertension is when the force of blood pumping through your arteries is too strong. If this condition is not controlled, it may put you at risk for serious complications. Your personal target blood pressure may vary depending on your medical conditions, your age, and other factors. For most people, a normal blood pressure is less than 120/80. Hypertension is treated with lifestyle changes, medicines, or a combination of both. Lifestyle changes include losing weight, eating a healthy,   low-sodium diet, exercising more, and limiting alcohol. This information is not intended to replace advice given to you by your health care provider. Make sure you discuss any questions you have with your health care provider. Document Revised: 06/29/2021 Document Reviewed: 06/29/2021 Elsevier Patient Education  2024 [employer] Inc.  

## 2023-08-29 LAB — CMP14+EGFR
ALT: 15 [IU]/L (ref 0–32)
AST: 19 [IU]/L (ref 0–40)
Albumin: 4.5 g/dL (ref 3.9–4.9)
Alkaline Phosphatase: 83 [IU]/L (ref 44–121)
BUN/Creatinine Ratio: 11 — ABNORMAL LOW (ref 12–28)
BUN: 13 mg/dL (ref 8–27)
Bilirubin Total: 0.6 mg/dL (ref 0.0–1.2)
CO2: 26 mmol/L (ref 20–29)
Calcium: 10.2 mg/dL (ref 8.7–10.3)
Chloride: 102 mmol/L (ref 96–106)
Creatinine, Ser: 1.14 mg/dL — ABNORMAL HIGH (ref 0.57–1.00)
Globulin, Total: 2.3 g/dL (ref 1.5–4.5)
Glucose: 104 mg/dL — ABNORMAL HIGH (ref 70–99)
Potassium: 5.3 mmol/L — ABNORMAL HIGH (ref 3.5–5.2)
Sodium: 142 mmol/L (ref 134–144)
Total Protein: 6.8 g/dL (ref 6.0–8.5)
eGFR: 53 mL/min/{1.73_m2} — ABNORMAL LOW (ref >59)

## 2023-08-29 LAB — CBC WITH DIFFERENTIAL/PLATELET
Basophils Absolute: 0.1 10*3/uL (ref 0.0–0.2)
Basos: 1 %
EOS (ABSOLUTE): 0.3 10*3/uL (ref 0.0–0.4)
Eos: 5 %
Hematocrit: 46.6 % (ref 34.0–46.6)
Hemoglobin: 15 g/dL (ref 11.1–15.9)
Immature Grans (Abs): 0 10*3/uL (ref 0.0–0.1)
Immature Granulocytes: 1 %
Lymphocytes Absolute: 2.2 10*3/uL (ref 0.7–3.1)
Lymphs: 33 %
MCH: 29.4 pg (ref 26.6–33.0)
MCHC: 32.2 g/dL (ref 31.5–35.7)
MCV: 91 fL (ref 79–97)
Monocytes Absolute: 0.5 10*3/uL (ref 0.1–0.9)
Monocytes: 7 %
Neutrophils Absolute: 3.5 10*3/uL (ref 1.4–7.0)
Neutrophils: 53 %
Platelets: 321 10*3/uL (ref 150–450)
RBC: 5.1 x10E6/uL (ref 3.77–5.28)
RDW: 11.9 % (ref 11.7–15.4)
WBC: 6.6 10*3/uL (ref 3.4–10.8)

## 2023-08-29 LAB — LIPID PANEL
Chol/HDL Ratio: 2.4 {ratio} (ref 0.0–4.4)
Cholesterol, Total: 124 mg/dL (ref 100–199)
HDL: 51 mg/dL (ref >39)
LDL Chol Calc (NIH): 55 mg/dL (ref 0–99)
Triglycerides: 94 mg/dL (ref 0–149)
VLDL Cholesterol Cal: 18 mg/dL (ref 5–40)

## 2023-08-29 LAB — TSH: TSH: 0.446 u[IU]/mL — ABNORMAL LOW (ref 0.450–4.500)

## 2023-09-01 ENCOUNTER — Other Ambulatory Visit: Payer: Self-pay | Admitting: Family

## 2023-09-01 MED ORDER — LEVOTHYROXINE SODIUM 150 MCG PO TABS: 150.0000 ug | ORAL_TABLET | Freq: Every day | ORAL | 1 refills | Status: DC

## 2023-09-04 ENCOUNTER — Telehealth: Payer: Self-pay | Admitting: Family Medicine

## 2023-09-04 ENCOUNTER — Other Ambulatory Visit: Payer: Self-pay | Admitting: Family Medicine

## 2023-09-04 DIAGNOSIS — E034 Atrophy of thyroid (acquired): Secondary | ICD-10-CM

## 2023-09-04 NOTE — Telephone Encounter (Signed)
Called and spoke with patient on labs she states she will call back to make lab appt

## 2023-09-04 NOTE — Telephone Encounter (Signed)
Copied from CRM [id number]. Topic: Clinical - Lab/Test Results >> Sep 04, 2023 11:35 AM Carlatta H wrote: Reason for CRM: Patient would like a call back regarding labs

## 2023-09-28 ENCOUNTER — Telehealth: Payer: Self-pay

## 2023-09-28 NOTE — Telephone Encounter (Signed)
Copied from CRM [id number]. Topic: Clinical - Medication Question >> Sep 28, 2023 12:50 PM Madeline Clark wrote: Patient was taken off of a medication and put on a different kind... but she believes that she should be put back on the previous medication. Please call her  at [phone]

## 2023-09-29 MED ORDER — LOSARTAN POTASSIUM 50 MG PO TABS: 50.0000 mg | ORAL_TABLET | Freq: Every day | ORAL | 1 refills | Status: DC

## 2023-09-29 NOTE — Telephone Encounter (Signed)
Patient aware and verbalized understanding.

## 2023-09-29 NOTE — Addendum Note (Signed)
Addended by: Jannifer Rodney A on: 09/29/2023 04:12 PM   Modules accepted: Orders

## 2023-09-29 NOTE — Telephone Encounter (Signed)
Losartan 50 mg Prescription sent to pharmacy. Do not take this and lisinopril together. Needs follow up in 2-4 weeks with me.

## 2023-10-23 ENCOUNTER — Encounter: Payer: Self-pay | Admitting: Family

## 2023-10-23 ENCOUNTER — Ambulatory Visit: Payer: Medicare PPO | Admitting: Family

## 2023-10-23 VITALS — BP 129/76 | HR 66 | Temp 97.3°F | Ht 62.0 in | Wt 214.8 lb

## 2023-10-23 DIAGNOSIS — I10 Essential (primary) hypertension: Secondary | ICD-10-CM | POA: Diagnosis not present

## 2023-10-23 DIAGNOSIS — E034 Atrophy of thyroid (acquired): Secondary | ICD-10-CM

## 2023-10-23 DIAGNOSIS — Z780 Asymptomatic menopausal state: Secondary | ICD-10-CM | POA: Diagnosis not present

## 2023-10-23 NOTE — Patient Instructions (Signed)

## 2023-10-23 NOTE — Progress Notes (Signed)
 Subjective:    Patient ID: Madeline Clark, female    DOB: 1956/08/27, 68 y.o.   MRN: [id number]  Chief Complaint  Patient presents with   Hypertension   Pt presents to the office today for chronic follow up and HTN and hypothyroid. We added losartan 50 mg last visit. Her BP is at goal!  She is followed by Dermatologists annually for skin cancer.   She is morbid obese with a BMI of 39 with HTN and hyperlipidemia.   Thyroid Problem Presents for follow-up visit. Symptoms include dry skin. Patient reports no constipation, depressed mood, fatigue or hoarse voice. The symptoms have been stable.  Hypertension This is a chronic problem. The current episode started more than 1 year ago. The problem has been resolved since onset. Associated symptoms include shortness of breath ("if I walk a whole lot"). Pertinent negatives include no malaise/fatigue or peripheral edema. Risk factors for coronary artery disease include dyslipidemia, obesity and sedentary lifestyle. The current treatment provides moderate improvement. Identifiable causes of hypertension include a thyroid problem.  Hyperlipidemia This is a chronic problem. The current episode started more than 1 year ago. Exacerbating diseases include obesity. Associated symptoms include shortness of breath ("if I walk a whole lot"). Current antihyperlipidemic treatment includes statins. The current treatment provides mild improvement of lipids. Risk factors for coronary artery disease include dyslipidemia, hypertension, a sedentary lifestyle and post-menopausal.  Arthritis Presents for follow-up visit. She complains of pain and stiffness. Affected locations include the left MCP and right MCP. Her pain is at a severity of 5/10 (when sewing). Pertinent negatives include no fatigue.      Review of Systems  Constitutional:  Negative for fatigue and malaise/fatigue.  HENT:  Negative for hoarse voice.   Respiratory:  Positive for shortness of breath  ("if I walk a whole lot").   Gastrointestinal:  Negative for constipation.  Musculoskeletal:  Positive for stiffness.  All other systems reviewed and are negative.   Social History   Socioeconomic History   Marital status: Unknown    Spouse name: Not on file   Number of children: Not on file   Years of education: Not on file   Highest education level: Not on file  Occupational History    Employer: [employer]    Comment: retired  Tobacco Use   Smoking status: Never   Smokeless tobacco: Never  Vaping Use   Vaping status: Never Used  Substance and Sexual Activity   Alcohol use: No   Drug use: No   Sexual activity: Not on file  Other Topics Concern   Not on file  Social History Narrative   Not on file   Social Drivers of Health   Financial Resource Strain: Not on file  Food Insecurity: Not on file  Transportation Needs: Not on file  Physical Activity: Not on file  Stress: Not on file  Social Connections: Not on file   Family History  Problem Relation Age of Onset   Diabetes Mother    Heart disease Father    Cancer Father        skin, kidney   Cancer Sister 76       carcinoid tumor of ileum   Colon cancer Neg Hx    Esophageal cancer Neg Hx    Pancreatic cancer Neg Hx    Rectal cancer Neg Hx    Stomach cancer Neg Hx         Objective:   Physical Exam Vitals reviewed.  Constitutional:  General: She is not in acute distress.    Appearance: She is well-developed. She is obese.  HENT:     Head: Normocephalic and atraumatic.     Right Ear: Tympanic membrane normal.     Left Ear: Tympanic membrane normal.  Eyes:     Pupils: Pupils are equal, round, and reactive to light.  Neck:     Thyroid: No thyromegaly.  Cardiovascular:     Rate and Rhythm: Normal rate and regular rhythm.     Heart sounds: Normal heart sounds. No murmur heard. Pulmonary:     Effort: Pulmonary effort is normal. No respiratory distress.     Breath sounds: Normal  breath sounds. No wheezing.  Abdominal:     General: Bowel sounds are normal. There is no distension.     Palpations: Abdomen is soft.     Tenderness: There is no abdominal tenderness.  Musculoskeletal:        General: No tenderness. Normal range of motion.     Cervical back: Normal range of motion and neck supple.  Skin:    General: Skin is warm and dry.  Neurological:     Mental Status: She is alert and oriented to person, place, and time.     Cranial Nerves: No cranial nerve deficit.     Deep Tendon Reflexes: Reflexes are normal and symmetric.  Psychiatric:        Behavior: Behavior normal.        Thought Content: Thought content normal.        Judgment: Judgment normal.       BP 129/76   Pulse 66   Temp (!) 97.3 F (36.3 C)   Ht 5\' 2"  (1.575 m)   Wt 214 lb 12.8 oz (97.4 kg)   SpO2 98%   BMI 39.29 kg/m      Assessment & Plan:  [name] comes in today with chief complaint of Hypertension   Diagnosis and orders addressed:  1. Essential hypertension, benign (Primary) - CMP14+EGFR  2. Hypothyroidism due to acquired atrophy of thyroid - CMP14+EGFR - TSH  3. Morbid obesity (HCC) - CMP14+EGFR  4. Post-menopausal - DG [facility] DEXA   Labs pending Continue current medications  Keep follow up with specialists  Health Maintenance reviewed Diet and exercise encouraged  Return in about 6 months (around 04/21/2024), or if symptoms worsen or fail to improve.    Jannifer Rodney, FNP

## 2023-10-24 LAB — CMP14+EGFR
ALT: 18 [IU]/L (ref 0–32)
AST: 17 [IU]/L (ref 0–40)
Albumin: 4.5 g/dL (ref 3.9–4.9)
Alkaline Phosphatase: 83 [IU]/L (ref 44–121)
BUN/Creatinine Ratio: 15 (ref 12–28)
BUN: 14 mg/dL (ref 8–27)
Bilirubin Total: 0.6 mg/dL (ref 0.0–1.2)
CO2: 24 mmol/L (ref 20–29)
Calcium: 10 mg/dL (ref 8.7–10.3)
Chloride: 103 mmol/L (ref 96–106)
Creatinine, Ser: 0.92 mg/dL (ref 0.57–1.00)
Globulin, Total: 1.9 g/dL (ref 1.5–4.5)
Glucose: 94 mg/dL (ref 70–99)
Potassium: 4.4 mmol/L (ref 3.5–5.2)
Sodium: 143 mmol/L (ref 134–144)
Total Protein: 6.4 g/dL (ref 6.0–8.5)
eGFR: 69 mL/min/{1.73_m2} (ref >59)

## 2023-10-24 LAB — TSH: TSH: 0.036 u[IU]/mL — ABNORMAL LOW (ref 0.450–4.500)

## 2023-10-26 ENCOUNTER — Other Ambulatory Visit: Payer: Self-pay | Admitting: Family

## 2023-10-26 MED ORDER — LEVOTHYROXINE SODIUM 125 MCG PO TABS: 125.0000 ug | ORAL_TABLET | Freq: Every day | ORAL | 3 refills | Status: DC

## 2023-11-27 ENCOUNTER — Ambulatory Visit: Payer: Medicare PPO | Admitting: Family

## 2023-12-05 ENCOUNTER — Ambulatory Visit (INDEPENDENT_AMBULATORY_CARE_PROVIDER_SITE_OTHER): Payer: Medicare PPO

## 2023-12-05 ENCOUNTER — Encounter: Payer: Self-pay | Admitting: Family

## 2023-12-05 ENCOUNTER — Ambulatory Visit (INDEPENDENT_AMBULATORY_CARE_PROVIDER_SITE_OTHER): Payer: Medicare PPO | Admitting: Family

## 2023-12-05 VITALS — BP 113/71 | HR 96 | Temp 97.0°F | Ht 62.0 in | Wt 214.8 lb

## 2023-12-05 DIAGNOSIS — R1310 Dysphagia, unspecified: Secondary | ICD-10-CM

## 2023-12-05 DIAGNOSIS — Z0000 Encounter for general adult medical examination without abnormal findings: Secondary | ICD-10-CM

## 2023-12-05 DIAGNOSIS — R0683 Snoring: Secondary | ICD-10-CM | POA: Diagnosis not present

## 2023-12-05 DIAGNOSIS — E039 Hypothyroidism, unspecified: Secondary | ICD-10-CM

## 2023-12-05 DIAGNOSIS — Z0001 Encounter for general adult medical examination with abnormal findings: Secondary | ICD-10-CM | POA: Diagnosis not present

## 2023-12-05 DIAGNOSIS — I4510 Unspecified right bundle-branch block: Secondary | ICD-10-CM | POA: Diagnosis not present

## 2023-12-05 DIAGNOSIS — Z780 Asymptomatic menopausal state: Secondary | ICD-10-CM | POA: Diagnosis not present

## 2023-12-05 MED ORDER — OMEPRAZOLE 20 MG PO CPDR: 20.0000 mg | DELAYED_RELEASE_CAPSULE | Freq: Every day | ORAL | 1 refills | Status: DC

## 2023-12-05 NOTE — Patient Instructions (Addendum)
 Sleep Apnea  Sleep apnea is a condition that affects your breathing while you are sleeping. Your tongue or soft tissue in your throat may block the flow of air while you sleep. You may have shallow breathing or stop breathing for short periods of time. People with sleep apnea may snore loudly. There are three kinds of sleep apnea: Obstructive sleep apnea. This kind is caused by a blocked or collapsed airway. This is the most common. Central sleep apnea. This kind happens when the part of the brain that controls breathing does not send the correct signals to the muscles that control breathing. Mixed sleep apnea. This is a combination of obstructive and central sleep apnea. What are the causes? The most common cause of sleep apnea is a collapsed or blocked airway. What increases the risk? Being very overweight. Having family members with sleep apnea. Having a tongue or tonsils that are larger than normal. Having a small airway or jaw problems. Being older. What are the signs or symptoms? Loud snoring. Restless sleep. Trouble staying asleep. Being sleepy or tired during the day. Waking up gasping or choking. Having a headache in the morning. Mood swings. Having a hard time remembering things and concentrating. How is this diagnosed? A medical history. A physical exam. A sleep study. This is also called a polysomnography test. This test is done at a sleep lab or in your home while you are sleeping. How is this treated? Treatment may include: Sleeping on your side. Losing weight if you're overweight. Wearing an oral appliance. This is a mouthpiece that moves your lower jaw forward. Using a positive airway pressure (PAP) device to keep your airways open while you sleep, such as: A continuous positive airway pressure (CPAP) device. This device gives forced air through a mask when you breathe out. This keeps your airways open. A bilevel positive airway pressure (BIPAP) device. This device  gives forced air through a mask when you breathe in and when you breathe out to keep your airways open. Having surgery if other treatments do not work. If your sleep apnea is not treated, you may be at risk for: Heart failure. Heart attack. Stroke. Type 2 diabetes or a problem with your blood sugar called insulin resistance. Follow these instructions at home: Medicines Take your medicines only as told by your health care provider. Avoid alcohol, medicines to help you relax, and certain pain medicines. These may make sleep apnea worse. General instructions Do not smoke, vape, or use products with nicotine or tobacco in them. If you need help quitting, talk with your provider. If you were given a PAP device to open your airway while you sleep, use it as told by your provider. If you're having surgery, make sure to tell your provider you have sleep apnea. You may need to bring your PAP device with you. Contact a health care provider if: The PAP device that you were given to use during sleep bothers you or does not seem to be working. You do not feel better or you feel worse. Get help right away if: You have trouble breathing. You have chest pain. You have trouble talking. One side of your body feels weak. A part of your face is hanging down. These symptoms may be an emergency. Call 911 right away. Do not wait to see if the symptoms will go away. Do not drive yourself to the hospital. This information is not intended to replace advice given to you by your health care provider. Make sure  you discuss any questions you have with your health care provider.  Dysphagia  Dysphagia is trouble swallowing. This condition occurs when solids and liquids stick in a person's throat on the way down to the stomach, or when food takes longer to get to the stomach than usual. You may have problems swallowing food, liquids, or both. You may also have pain while trying to swallow. It may take you more time  and effort to swallow something. What are the causes? This condition may be caused by: Muscle problems. These may make it difficult for you to move food and liquids through the esophagus, which is the tube that connects your mouth to your stomach. Blockages. You may have ulcers, scar tissue, or inflammation that blocks the normal passage of food and liquids. Causes of these problems include: Acid reflux from your stomach into your esophagus (gastroesophageal reflux). Infections. Radiation treatment for cancer. Medicines taken without enough fluids to wash them down into your stomach. Stroke. This can affect the nerves and make it difficult to swallow. Nerve problems. These prevent signals from being sent to the muscles of your esophagus to squeeze (contract) and move what you swallow down to your stomach. Globus pharyngeus. This is a common problem that involves a feeling like something is stuck in your throat or a sense of trouble with swallowing, even though nothing is wrong with the swallowing passages. Certain conditions, such as cerebral palsy or Parkinson's disease. What are the signs or symptoms? Common symptoms of this condition include: A feeling that solids or liquids are stuck in your throat on the way down to the stomach. Pain while swallowing. Coughing or gagging while trying to swallow. Other symptoms include: Food moving back from your stomach to your mouth (regurgitation). Noises coming from your throat. Chest discomfort when swallowing. A feeling of fullness when swallowing. Drooling, especially when the throat is blocked. Heartburn. How is this diagnosed? This condition may be diagnosed by: Barium swallow X-ray. In this test, you will swallow a white liquid that sticks to the inside of your esophagus. X-ray images are then taken. Endoscopy. In this test, a flexible telescope is inserted down your throat to look at your esophagus and your stomach. CT scans or an  MRI. How is this treated? Treatment for dysphagia depends on the cause of this condition: If the dysphagia is caused by acid reflux or infection, medicines may be used. These may include antibiotics or heartburn medicines. If the dysphagia is caused by problems with the muscles, swallowing therapy may be used to help you strengthen your swallowing muscles. You may have to do specific exercises to strengthen the muscles or stretch them. If the dysphagia is caused by a blockage or mass, procedures to remove the blockage may be done. You may need surgery and a feeding tube. You may need to make diet changes. Ask your health care provider for specific instructions. Follow these instructions at home: Medicines Take over-the-counter and prescription medicines only as told by your health care provider. If you were prescribed an antibiotic medicine, take it as told by your health care provider. Do not stop taking the antibiotic even if you start to feel better. Eating and drinking  Make any diet changes as told by your health care provider. Work with a diet and nutrition specialist (dietitian) to create an eating plan that will help you get the nutrients you need in order to stay healthy. Eat soft foods that are easier to swallow. Cut your food into small  pieces and eat slowly. Take small bites. Eat and drink only when you are sitting upright. Do not drink alcohol or caffeine. If you need help quitting, ask your health care provider. General instructions Check your weight every day to make sure you are not losing weight. Do not use any products that contain nicotine or tobacco. These products include cigarettes, chewing tobacco, and vaping devices, such as e-cigarettes. If you need help quitting, ask your health care provider. Keep all follow-up visits. This is important. Contact a health care provider if: You lose weight because you cannot swallow. You cough when you drink liquids. You cough up  partially digested food. Get help right away if: You cannot swallow your saliva. You have shortness of breath, a fever, or both. Your voice is hoarse and you have trouble swallowing. These symptoms may represent a serious problem that is an emergency. Do not wait to see if the symptoms will go away. Get medical help right away. Call your local emergency services (911 in the [place] Do not drive yourself to the hospital. Summary Dysphagia is trouble swallowing. This condition occurs when solids and liquids stick in a person's throat on the way down to the stomach. You may cough or gag while trying to swallow. Dysphagia has many possible causes. Treatment for dysphagia depends on the cause of the condition. Keep all follow-up visits. This is important. This information is not intended to replace advice given to you by your health care provider. Make sure you discuss any questions you have with your health care provider. Document Revised: 04/10/2020 Document Reviewed: 04/11/2020 Elsevier Patient Education  2024 [employer] Inc. Document Revised: 05/25/2023 Document Reviewed: 10/27/2022 Elsevier Patient Education  2024 [employer].

## 2023-12-05 NOTE — Progress Notes (Signed)
 Subjective:    Madeline Clark is a 68 y.o. female who presents for a Welcome to Medicare exam. She is complaining of dysphagia that has occurred twice where she has got choked and had to throw up.   Cardiac Risk Factors include: advanced age (>59men, >44 women);hypertension;obesity (BMI >30kg/m2)      Objective:    Today's Vitals   12/05/23 0812  BP: 113/71  Pulse: 96  Temp: (!) 97 F (36.1 C)  TempSrc: Temporal  SpO2: 97%  Weight: 214 lb 12.8 oz (97.4 kg)  Height: 5\' 2"  (1.575 m)  Body mass index is 39.29 kg/m.  Medications Outpatient Encounter Medications as of 12/05/2023  Medication Sig   Blood Pressure KIT 1 Device by Does not apply route 2 (two) times daily.   diclofenac (VOLTAREN) 75 MG EC tablet Take 1 tablet (75 mg total) by mouth 2 (two) times daily.   hydrochlorothiazide (HYDRODIURIL) 25 MG tablet TAKE ONE (1) TABLET EACH DAY   levothyroxine (SYNTHROID) 125 MCG tablet Take 1 tablet (125 mcg total) by mouth daily.   losartan (COZAAR) 50 MG tablet Take 1 tablet (50 mg total) by mouth daily.   simvastatin (ZOCOR) 40 MG tablet Take 1 tablet (40 mg total) by mouth at bedtime.   No facility-administered encounter medications on file as of 12/05/2023.     History: Past Medical History:  Diagnosis Date   Hyperlipidemia    Hypertension    Thyroid disease    hypoactive   Past Surgical History:  Procedure Laterality Date   BREAST SURGERY  09/06/2007   knots removed from r breast   COLONOSCOPY     FACIAL RECONSTRUCTION SURGERY      Family History  Problem Relation Age of Onset   Diabetes Mother    Heart disease Father    Cancer Father        skin, kidney   Cancer Sister 60       carcinoid tumor of ileum   Colon cancer Neg Hx    Esophageal cancer Neg Hx    Pancreatic cancer Neg Hx    Rectal cancer Neg Hx    Stomach cancer Neg Hx    Social History   Occupational History    Employer: [employer]    Comment: retired  Tobacco Use   Smoking  status: Never   Smokeless tobacco: Never  Vaping Use   Vaping status: Never Used  Substance and Sexual Activity   Alcohol use: No   Drug use: No   Sexual activity: Not on file    Tobacco Counseling Counseling given: Not Answered   Immunizations and Health Maintenance Immunization History  Administered Date(s) Administered   Influenza-Unspecified 08/14/2008   PFIZER(Purple Top)SARS-COV-2 Vaccination 05/27/2020, 06/17/2020   Tdap 06/06/2011, 05/16/2022   Zoster Recombinant(Shingrix) 05/13/2021, 11/10/2021   There are no preventive care reminders to display for this patient.  Activities of Daily Living    12/05/2023    7:59 AM  In your present state of health, do you have any difficulty performing the following activities:  Hearing? 0  Vision? 1  Difficulty concentrating or making decisions? 0  Walking or climbing stairs? 0  Dressing or bathing? 0  Doing errands, shopping? 0  Preparing Food and eating ? N  Using the Toilet? N  In the past six months, have you accidently leaked urine? Y  Do you have problems with loss of bowel control? N  Managing your Medications? N  Managing your Finances? N  Housekeeping  or managing your Housekeeping? N    Physical Exam   Physical Exam (optional), or other factors deemed appropriate based on the beneficiary's medical and social history and current clinical standards.   Advanced Directives: Does Patient Have a Medical Advance Directive?: No Would patient like information on creating a medical advance directive?: No - Patient declined  EKG:  sinus bradycardia, RBBB      Assessment:    This is a routine wellness examination for this patient .   Vision/Hearing screen No results found.   Goals      DIET - INCREASE WATER INTAKE        Depression Screen    12/05/2023    8:08 AM 08/28/2023    8:53 AM 01/16/2023    8:04 AM 11/14/2022    8:02 AM  PHQ 2/9 Scores  PHQ - 2 Score 0 0 0 0  PHQ- 9 Score  2  2     Fall  Risk    12/05/2023    8:08 AM  Fall Risk   Falls in the past year? 0  Number falls in past yr: 0  Injury with Fall? 0  Risk for fall due to : No Fall Risks  Follow up Falls evaluation completed    Cognitive Function:        12/05/2023    8:08 AM  6CIT Screen  What Year? 0 points  What month? 0 points  What time? 0 points  Count back from 20 0 points  Months in reverse 0 points  Repeat phrase 0 points  Total Score 0 points    Patient Care Team: Madeline Spencer, FNP as PCP - General (Family Medicine)     Plan:     I have personally reviewed and noted the following in the patient's chart:   Medical and social history Use of alcohol, tobacco or illicit drugs  Current medications and supplements Functional ability and status Nutritional status Physical activity Advanced directives List of other physicians Hospitalizations, surgeries, and ER visits in previous 12 months Vitals Screenings to include cognitive, depression, and falls Referrals and appointments  In addition, I have reviewed and discussed with patient certain preventive protocols, quality metrics, and best practice recommendations. A written personalized care plan for preventive services as well as general preventive health recommendations were provided to patient.    1. Morbid obesity (HCC) - EKG 12-Lead - Ambulatory referral to Sleep Studies  2. Encounter for Medicare annual wellness exam (Primary)  3. Right bundle branch block - Ambulatory referral to Sleep Studies  4. Dysphagia, unspecified type - Ambulatory referral to Gastroenterology - omeprazole (PRILOSEC) 20 MG capsule; Take 1 capsule (20 mg total) by mouth daily.  Dispense: 90 capsule; Refill: 1  5. Snores - Ambulatory referral to Sleep Studies  6. Hypothyroidism, unspecified type - TSH  Referral to sleep study to rule out OSA Start omeprazole  20 mg  -Diet discussed- Avoid fried, spicy, citrus foods, caffeine and alcohol -Do not  eat 2-3 hours before bedtime -Encouraged small frequent meals -Avoid NSAID's Follow up if symptoms worsen    Jannifer Rodney, FNP 12/05/2023

## 2023-12-06 LAB — TSH: TSH: 0.05 u[IU]/mL — ABNORMAL LOW (ref 0.450–4.500)

## 2023-12-07 ENCOUNTER — Other Ambulatory Visit: Payer: Self-pay | Admitting: Family

## 2023-12-07 MED ORDER — LEVOTHYROXINE SODIUM 112 MCG PO TABS: 112.0000 ug | ORAL_TABLET | Freq: Every day | ORAL | 1 refills | Status: DC

## 2023-12-15 ENCOUNTER — Encounter: Payer: Self-pay | Admitting: Gastroenterology

## 2024-01-01 ENCOUNTER — Other Ambulatory Visit: Payer: Self-pay | Admitting: Family

## 2024-01-24 DIAGNOSIS — M9903 Segmental and somatic dysfunction of lumbar region: Secondary | ICD-10-CM | POA: Diagnosis not present

## 2024-01-24 DIAGNOSIS — M62830 Muscle spasm of back: Secondary | ICD-10-CM | POA: Diagnosis not present

## 2024-01-24 DIAGNOSIS — M9901 Segmental and somatic dysfunction of cervical region: Secondary | ICD-10-CM | POA: Diagnosis not present

## 2024-01-24 DIAGNOSIS — M9902 Segmental and somatic dysfunction of thoracic region: Secondary | ICD-10-CM | POA: Diagnosis not present

## 2024-01-25 DIAGNOSIS — M9903 Segmental and somatic dysfunction of lumbar region: Secondary | ICD-10-CM | POA: Diagnosis not present

## 2024-01-25 DIAGNOSIS — M9901 Segmental and somatic dysfunction of cervical region: Secondary | ICD-10-CM | POA: Diagnosis not present

## 2024-01-25 DIAGNOSIS — M9902 Segmental and somatic dysfunction of thoracic region: Secondary | ICD-10-CM | POA: Diagnosis not present

## 2024-01-25 DIAGNOSIS — M62830 Muscle spasm of back: Secondary | ICD-10-CM | POA: Diagnosis not present

## 2024-01-30 DIAGNOSIS — M9901 Segmental and somatic dysfunction of cervical region: Secondary | ICD-10-CM | POA: Diagnosis not present

## 2024-01-30 DIAGNOSIS — M9903 Segmental and somatic dysfunction of lumbar region: Secondary | ICD-10-CM | POA: Diagnosis not present

## 2024-01-30 DIAGNOSIS — M9902 Segmental and somatic dysfunction of thoracic region: Secondary | ICD-10-CM | POA: Diagnosis not present

## 2024-01-30 DIAGNOSIS — M62830 Muscle spasm of back: Secondary | ICD-10-CM | POA: Diagnosis not present

## 2024-01-31 DIAGNOSIS — M9903 Segmental and somatic dysfunction of lumbar region: Secondary | ICD-10-CM | POA: Diagnosis not present

## 2024-01-31 DIAGNOSIS — M9901 Segmental and somatic dysfunction of cervical region: Secondary | ICD-10-CM | POA: Diagnosis not present

## 2024-01-31 DIAGNOSIS — M62830 Muscle spasm of back: Secondary | ICD-10-CM | POA: Diagnosis not present

## 2024-01-31 DIAGNOSIS — M9902 Segmental and somatic dysfunction of thoracic region: Secondary | ICD-10-CM | POA: Diagnosis not present

## 2024-02-01 DIAGNOSIS — M9902 Segmental and somatic dysfunction of thoracic region: Secondary | ICD-10-CM | POA: Diagnosis not present

## 2024-02-01 DIAGNOSIS — M9901 Segmental and somatic dysfunction of cervical region: Secondary | ICD-10-CM | POA: Diagnosis not present

## 2024-02-01 DIAGNOSIS — M62830 Muscle spasm of back: Secondary | ICD-10-CM | POA: Diagnosis not present

## 2024-02-01 DIAGNOSIS — M9903 Segmental and somatic dysfunction of lumbar region: Secondary | ICD-10-CM | POA: Diagnosis not present

## 2024-02-05 DIAGNOSIS — M9903 Segmental and somatic dysfunction of lumbar region: Secondary | ICD-10-CM | POA: Diagnosis not present

## 2024-02-05 DIAGNOSIS — M9901 Segmental and somatic dysfunction of cervical region: Secondary | ICD-10-CM | POA: Diagnosis not present

## 2024-02-05 DIAGNOSIS — M9902 Segmental and somatic dysfunction of thoracic region: Secondary | ICD-10-CM | POA: Diagnosis not present

## 2024-02-05 DIAGNOSIS — M62830 Muscle spasm of back: Secondary | ICD-10-CM | POA: Diagnosis not present

## 2024-02-07 DIAGNOSIS — M9902 Segmental and somatic dysfunction of thoracic region: Secondary | ICD-10-CM | POA: Diagnosis not present

## 2024-02-07 DIAGNOSIS — M9901 Segmental and somatic dysfunction of cervical region: Secondary | ICD-10-CM | POA: Diagnosis not present

## 2024-02-07 DIAGNOSIS — M9903 Segmental and somatic dysfunction of lumbar region: Secondary | ICD-10-CM | POA: Diagnosis not present

## 2024-02-07 DIAGNOSIS — M62830 Muscle spasm of back: Secondary | ICD-10-CM | POA: Diagnosis not present

## 2024-02-08 DIAGNOSIS — M9903 Segmental and somatic dysfunction of lumbar region: Secondary | ICD-10-CM | POA: Diagnosis not present

## 2024-02-08 DIAGNOSIS — M9901 Segmental and somatic dysfunction of cervical region: Secondary | ICD-10-CM | POA: Diagnosis not present

## 2024-02-08 DIAGNOSIS — M9902 Segmental and somatic dysfunction of thoracic region: Secondary | ICD-10-CM | POA: Diagnosis not present

## 2024-02-08 DIAGNOSIS — M62830 Muscle spasm of back: Secondary | ICD-10-CM | POA: Diagnosis not present

## 2024-02-12 ENCOUNTER — Ambulatory Visit: Payer: Self-pay | Admitting: Gastroenterology

## 2024-02-12 DIAGNOSIS — M9902 Segmental and somatic dysfunction of thoracic region: Secondary | ICD-10-CM | POA: Diagnosis not present

## 2024-02-12 DIAGNOSIS — M9903 Segmental and somatic dysfunction of lumbar region: Secondary | ICD-10-CM | POA: Diagnosis not present

## 2024-02-12 DIAGNOSIS — M62830 Muscle spasm of back: Secondary | ICD-10-CM | POA: Diagnosis not present

## 2024-02-12 DIAGNOSIS — M9901 Segmental and somatic dysfunction of cervical region: Secondary | ICD-10-CM | POA: Diagnosis not present

## 2024-02-14 DIAGNOSIS — M9902 Segmental and somatic dysfunction of thoracic region: Secondary | ICD-10-CM | POA: Diagnosis not present

## 2024-02-14 DIAGNOSIS — M9901 Segmental and somatic dysfunction of cervical region: Secondary | ICD-10-CM | POA: Diagnosis not present

## 2024-02-14 DIAGNOSIS — M62830 Muscle spasm of back: Secondary | ICD-10-CM | POA: Diagnosis not present

## 2024-02-14 DIAGNOSIS — M9903 Segmental and somatic dysfunction of lumbar region: Secondary | ICD-10-CM | POA: Diagnosis not present

## 2024-02-19 DIAGNOSIS — M9901 Segmental and somatic dysfunction of cervical region: Secondary | ICD-10-CM | POA: Diagnosis not present

## 2024-02-19 DIAGNOSIS — M9902 Segmental and somatic dysfunction of thoracic region: Secondary | ICD-10-CM | POA: Diagnosis not present

## 2024-02-19 DIAGNOSIS — M9903 Segmental and somatic dysfunction of lumbar region: Secondary | ICD-10-CM | POA: Diagnosis not present

## 2024-02-19 DIAGNOSIS — M62830 Muscle spasm of back: Secondary | ICD-10-CM | POA: Diagnosis not present

## 2024-02-22 DIAGNOSIS — M9902 Segmental and somatic dysfunction of thoracic region: Secondary | ICD-10-CM | POA: Diagnosis not present

## 2024-02-22 DIAGNOSIS — M9901 Segmental and somatic dysfunction of cervical region: Secondary | ICD-10-CM | POA: Diagnosis not present

## 2024-02-22 DIAGNOSIS — M62830 Muscle spasm of back: Secondary | ICD-10-CM | POA: Diagnosis not present

## 2024-02-22 DIAGNOSIS — M9903 Segmental and somatic dysfunction of lumbar region: Secondary | ICD-10-CM | POA: Diagnosis not present

## 2024-02-27 DIAGNOSIS — M9901 Segmental and somatic dysfunction of cervical region: Secondary | ICD-10-CM | POA: Diagnosis not present

## 2024-02-27 DIAGNOSIS — M62830 Muscle spasm of back: Secondary | ICD-10-CM | POA: Diagnosis not present

## 2024-02-27 DIAGNOSIS — M9903 Segmental and somatic dysfunction of lumbar region: Secondary | ICD-10-CM | POA: Diagnosis not present

## 2024-02-27 DIAGNOSIS — M9902 Segmental and somatic dysfunction of thoracic region: Secondary | ICD-10-CM | POA: Diagnosis not present

## 2024-03-05 ENCOUNTER — Other Ambulatory Visit: Payer: Self-pay | Admitting: Family

## 2024-03-05 DIAGNOSIS — M9901 Segmental and somatic dysfunction of cervical region: Secondary | ICD-10-CM | POA: Diagnosis not present

## 2024-03-05 DIAGNOSIS — M62830 Muscle spasm of back: Secondary | ICD-10-CM | POA: Diagnosis not present

## 2024-03-05 DIAGNOSIS — M9902 Segmental and somatic dysfunction of thoracic region: Secondary | ICD-10-CM | POA: Diagnosis not present

## 2024-03-05 DIAGNOSIS — M9903 Segmental and somatic dysfunction of lumbar region: Secondary | ICD-10-CM | POA: Diagnosis not present

## 2024-03-05 DIAGNOSIS — R1310 Dysphagia, unspecified: Secondary | ICD-10-CM

## 2024-03-07 DIAGNOSIS — M9901 Segmental and somatic dysfunction of cervical region: Secondary | ICD-10-CM | POA: Diagnosis not present

## 2024-03-07 DIAGNOSIS — M9902 Segmental and somatic dysfunction of thoracic region: Secondary | ICD-10-CM | POA: Diagnosis not present

## 2024-03-07 DIAGNOSIS — M62830 Muscle spasm of back: Secondary | ICD-10-CM | POA: Diagnosis not present

## 2024-03-07 DIAGNOSIS — M9903 Segmental and somatic dysfunction of lumbar region: Secondary | ICD-10-CM | POA: Diagnosis not present

## 2024-03-12 DIAGNOSIS — M9903 Segmental and somatic dysfunction of lumbar region: Secondary | ICD-10-CM | POA: Diagnosis not present

## 2024-03-12 DIAGNOSIS — M9902 Segmental and somatic dysfunction of thoracic region: Secondary | ICD-10-CM | POA: Diagnosis not present

## 2024-03-12 DIAGNOSIS — M62830 Muscle spasm of back: Secondary | ICD-10-CM | POA: Diagnosis not present

## 2024-03-12 DIAGNOSIS — M9901 Segmental and somatic dysfunction of cervical region: Secondary | ICD-10-CM | POA: Diagnosis not present

## 2024-03-18 DIAGNOSIS — M9903 Segmental and somatic dysfunction of lumbar region: Secondary | ICD-10-CM | POA: Diagnosis not present

## 2024-03-18 DIAGNOSIS — M62830 Muscle spasm of back: Secondary | ICD-10-CM | POA: Diagnosis not present

## 2024-03-18 DIAGNOSIS — M9902 Segmental and somatic dysfunction of thoracic region: Secondary | ICD-10-CM | POA: Diagnosis not present

## 2024-03-18 DIAGNOSIS — M9901 Segmental and somatic dysfunction of cervical region: Secondary | ICD-10-CM | POA: Diagnosis not present

## 2024-03-21 DIAGNOSIS — M9901 Segmental and somatic dysfunction of cervical region: Secondary | ICD-10-CM | POA: Diagnosis not present

## 2024-03-21 DIAGNOSIS — M62830 Muscle spasm of back: Secondary | ICD-10-CM | POA: Diagnosis not present

## 2024-03-21 DIAGNOSIS — M9903 Segmental and somatic dysfunction of lumbar region: Secondary | ICD-10-CM | POA: Diagnosis not present

## 2024-03-21 DIAGNOSIS — M9902 Segmental and somatic dysfunction of thoracic region: Secondary | ICD-10-CM | POA: Diagnosis not present

## 2024-03-25 DIAGNOSIS — M9901 Segmental and somatic dysfunction of cervical region: Secondary | ICD-10-CM | POA: Diagnosis not present

## 2024-03-25 DIAGNOSIS — M9903 Segmental and somatic dysfunction of lumbar region: Secondary | ICD-10-CM | POA: Diagnosis not present

## 2024-03-25 DIAGNOSIS — M9902 Segmental and somatic dysfunction of thoracic region: Secondary | ICD-10-CM | POA: Diagnosis not present

## 2024-03-25 DIAGNOSIS — M62830 Muscle spasm of back: Secondary | ICD-10-CM | POA: Diagnosis not present

## 2024-04-22 ENCOUNTER — Ambulatory Visit: Payer: Medicare PPO | Admitting: Family

## 2024-04-22 ENCOUNTER — Encounter: Payer: Self-pay | Admitting: Family

## 2024-04-22 VITALS — BP 119/68 | HR 63 | Temp 97.9°F | Ht 62.0 in | Wt 214.0 lb

## 2024-04-22 DIAGNOSIS — E785 Hyperlipidemia, unspecified: Secondary | ICD-10-CM | POA: Diagnosis not present

## 2024-04-22 DIAGNOSIS — E034 Atrophy of thyroid (acquired): Secondary | ICD-10-CM | POA: Diagnosis not present

## 2024-04-22 DIAGNOSIS — M9902 Segmental and somatic dysfunction of thoracic region: Secondary | ICD-10-CM | POA: Diagnosis not present

## 2024-04-22 DIAGNOSIS — M19041 Primary osteoarthritis, right hand: Secondary | ICD-10-CM

## 2024-04-22 DIAGNOSIS — Z1211 Encounter for screening for malignant neoplasm of colon: Secondary | ICD-10-CM

## 2024-04-22 DIAGNOSIS — R1310 Dysphagia, unspecified: Secondary | ICD-10-CM | POA: Diagnosis not present

## 2024-04-22 DIAGNOSIS — M62830 Muscle spasm of back: Secondary | ICD-10-CM | POA: Diagnosis not present

## 2024-04-22 DIAGNOSIS — Z0001 Encounter for general adult medical examination with abnormal findings: Secondary | ICD-10-CM | POA: Diagnosis not present

## 2024-04-22 DIAGNOSIS — I10 Essential (primary) hypertension: Secondary | ICD-10-CM | POA: Diagnosis not present

## 2024-04-22 DIAGNOSIS — M9903 Segmental and somatic dysfunction of lumbar region: Secondary | ICD-10-CM | POA: Diagnosis not present

## 2024-04-22 DIAGNOSIS — Z0000 Encounter for general adult medical examination without abnormal findings: Secondary | ICD-10-CM

## 2024-04-22 DIAGNOSIS — M9901 Segmental and somatic dysfunction of cervical region: Secondary | ICD-10-CM | POA: Diagnosis not present

## 2024-04-22 LAB — LIPID PANEL

## 2024-04-22 MED ORDER — HYDROCHLOROTHIAZIDE 25 MG PO TABS
ORAL_TABLET | ORAL | 2 refills | Status: AC
Start: 2024-04-22 — End: ?

## 2024-04-22 MED ORDER — DICLOFENAC SODIUM 75 MG PO TBEC
75.0000 mg | DELAYED_RELEASE_TABLET | Freq: Two times a day (BID) | ORAL | 2 refills | Status: AC
Start: 2024-04-22 — End: ?

## 2024-04-22 MED ORDER — OMEPRAZOLE 20 MG PO CPDR: 20.0000 mg | DELAYED_RELEASE_CAPSULE | Freq: Every day | ORAL | 3 refills | Status: AC

## 2024-04-22 MED ORDER — SIMVASTATIN 40 MG PO TABS: 40.0000 mg | ORAL_TABLET | Freq: Every day | ORAL | 2 refills | Status: AC

## 2024-04-22 NOTE — Patient Instructions (Signed)
 Health Maintenance After Age 66 After age 27, you are at a higher risk for certain long-term diseases and infections as well as injuries from falls. Falls are a major cause of broken bones and head injuries in people who are older than age 73. Getting regular preventive care can help to keep you healthy and well. Preventive care includes getting regular testing and making lifestyle changes as recommended by your health care provider. Talk with your health care provider about: Which screenings and tests you should have. A screening is a test that checks for a disease when you have no symptoms. A diet and exercise plan that is right for you. What should I know about screenings and tests to prevent falls? Screening and testing are the best ways to find a health problem early. Early diagnosis and treatment give you the best chance of managing medical conditions that are common after age 90. Certain conditions and lifestyle choices may make you more likely to have a fall. Your health care provider may recommend: Regular vision checks. Poor vision and conditions such as cataracts can make you more likely to have a fall. If you wear glasses, make sure to get your prescription updated if your vision changes. Medicine review. Work with your health care provider to regularly review all of the medicines you are taking, including over-the-counter medicines. Ask your health care provider about any side effects that may make you more likely to have a fall. Tell your health care provider if any medicines that you take make you feel dizzy or sleepy. Strength and balance checks. Your health care provider may recommend certain tests to check your strength and balance while standing, walking, or changing positions. Foot health exam. Foot pain and numbness, as well as not wearing proper footwear, can make you more likely to have a fall. Screenings, including: Osteoporosis screening. Osteoporosis is a condition that causes  the bones to get weaker and break more easily. Blood pressure screening. Blood pressure changes and medicines to control blood pressure can make you feel dizzy. Depression screening. You may be more likely to have a fall if you have a fear of falling, feel depressed, or feel unable to do activities that you used to do. Alcohol  use screening. Using too much alcohol  can affect your balance and may make you more likely to have a fall. Follow these instructions at home: Lifestyle Do not drink alcohol  if: Your health care provider tells you not to drink. If you drink alcohol : Limit how much you have to: 0-1 drink a day for women. 0-2 drinks a day for men. Know how much alcohol  is in your drink. In the [place] one drink equals one 12 oz bottle of beer (355 mL), one 5 oz glass of wine (148 mL), or one 1 oz glass of hard liquor (44 mL). Do not use any products that contain nicotine or tobacco. These products include cigarettes, chewing tobacco, and vaping devices, such as e-cigarettes. If you need help quitting, ask your health care provider. Activity  Follow a regular exercise program to stay fit. This will help you maintain your balance. Ask your health care provider what types of exercise are appropriate for you. If you need a cane or walker, use it as recommended by your health care provider. Wear supportive shoes that have nonskid soles. Safety  Remove any tripping hazards, such as rugs, cords, and clutter. Install safety equipment such as grab bars in bathrooms and safety rails on stairs. Keep rooms and walkways  well-lit. General instructions Talk with your health care provider about your risks for falling. Tell your health care provider if: You fall. Be sure to tell your health care provider about all falls, even ones that seem minor. You feel dizzy, tiredness (fatigue), or off-balance. Take over-the-counter and prescription medicines only as told by your health care provider. These include  supplements. Eat a healthy diet and maintain a healthy weight. A healthy diet includes low-fat dairy products, low-fat (lean) meats, and fiber from whole grains, beans, and lots of fruits and vegetables. Stay current with your vaccines. Schedule regular health, dental, and eye exams. Summary Having a healthy lifestyle and getting preventive care can help to protect your health and wellness after age 15. Screening and testing are the best way to find a health problem early and help you avoid having a fall. Early diagnosis and treatment give you the best chance for managing medical conditions that are more common for people who are older than age 42. Falls are a major cause of broken bones and head injuries in people who are older than age 64. Take precautions to prevent a fall at home. Work with your health care provider to learn what changes you can make to improve your health and wellness and to prevent falls. This information is not intended to replace advice given to you by your health care provider. Make sure you discuss any questions you have with your health care provider. Document Revised: 01/11/2021 Document Reviewed: 01/11/2021 Elsevier Patient Education  2024 [employer].

## 2024-04-22 NOTE — Progress Notes (Signed)
 Subjective:    Patient ID: Madeline Clark, female    DOB: 10-02-1955, 68 y.o.   MRN: [id number]  Chief Complaint  Patient presents with   Medical Management of Chronic Issues   Pt presents to the office today for CPE and chronic follow up.   She is followed by Dermatologists annually for skin cancer.   She is morbid obese with a BMI of 39 with HTN and hyperlipidemia.  Thyroid  Problem Presents for follow-up visit. Symptoms include dry skin. Patient reports no constipation, depressed mood, fatigue, hair loss or hoarse voice. The symptoms have been stable.  Hypertension This is a chronic problem. The current episode started more than 1 year ago. The problem has been resolved since onset. The problem is controlled. Associated symptoms include shortness of breath (if I walk a whole lot). Pertinent negatives include no malaise/fatigue or peripheral edema. Risk factors for coronary artery disease include dyslipidemia, obesity and sedentary lifestyle. The current treatment provides moderate improvement. Identifiable causes of hypertension include a thyroid  problem.  Hyperlipidemia This is a chronic problem. The current episode started more than 1 year ago. The problem is controlled. Recent lipid tests were reviewed and are normal. Exacerbating diseases include obesity. Associated symptoms include shortness of breath (if I walk a whole lot). Current antihyperlipidemic treatment includes statins. The current treatment provides moderate improvement of lipids. Risk factors for coronary artery disease include dyslipidemia, hypertension, a sedentary lifestyle, post-menopausal and obesity.  Arthritis Presents for follow-up visit. She complains of pain and stiffness. Affected locations include the left MCP and right MCP. Her pain is at a severity of 2/10. Pertinent negatives include no fatigue.  Gastroesophageal Reflux She reports no belching, no dysphagia or no hoarse voice. This is a chronic problem.  The current episode started more than 1 year ago. The problem occurs rarely. Pertinent negatives include no fatigue. She has tried a PPI for the symptoms. The treatment provided moderate relief.      Review of Systems  Constitutional:  Negative for fatigue and malaise/fatigue.  HENT:  Negative for hoarse voice.   Respiratory:  Positive for shortness of breath (if I walk a whole lot).   Gastrointestinal:  Negative for constipation and dysphagia.  Musculoskeletal:  Positive for stiffness.  All other systems reviewed and are negative.   Social History   Socioeconomic History   Marital status: Unknown    Spouse name: Not on file   Number of children: Not on file   Years of education: Not on file   Highest education level: Not on file  Occupational History    Employer: [employer]    Comment: retired  Tobacco Use   Smoking status: Never   Smokeless tobacco: Never  Vaping Use   Vaping status: Never Used  Substance and Sexual Activity   Alcohol use: No   Drug use: No   Sexual activity: Not on file  Other Topics Concern   Not on file  Social History Narrative   Not on file   Social Drivers of Health   Financial Resource Strain: Low Risk  (12/05/2023)   Overall Financial Resource Strain (CARDIA)    Difficulty of Paying Living Expenses: Not hard at all  Food Insecurity: No Food Insecurity (12/05/2023)   Hunger Vital Sign    Worried About Running Out of Food in the Last Year: Never true    Ran Out of Food in the Last Year: Never true  Transportation Needs: No Transportation Needs (12/05/2023)   PRAPARE -  Administrator, Civil Service (Medical): No    Lack of Transportation (Non-Medical): No  Physical Activity: Insufficiently Active (12/05/2023)   Exercise Vital Sign    Days of Exercise per Week: 2 days    Minutes of Exercise per Session: 30 min  Stress: No Stress Concern Present (12/05/2023)   [employer] of Occupational Health - Occupational  Stress Questionnaire    Feeling of Stress : Only a little  Recent Concern: Stress - Stress Concern Present (12/05/2023)   [employer] of Occupational Health - Occupational Stress Questionnaire    Feeling of Stress : Very much  Social Connections: Moderately Integrated (12/05/2023)   Social Connection and Isolation Panel    Frequency of Communication with Friends and Family: Twice a week    Frequency of Social Gatherings with Friends and Family: Once a week    Attends Religious Services: 1 to 4 times per year    Active Member of [employer] or Organizations: No    Attends Engineer, structural: Never    Marital Status: Married   Family History  Problem Relation Age of Onset   Diabetes Mother    Heart disease Father    Cancer Father        skin, kidney   Cancer Sister 45       carcinoid tumor of ileum   Colon cancer Neg Hx    Esophageal cancer Neg Hx    Pancreatic cancer Neg Hx    Rectal cancer Neg Hx    Stomach cancer Neg Hx         Objective:   Physical Exam Vitals reviewed.  Constitutional:      General: She is not in acute distress.    Appearance: She is well-developed. She is obese.  HENT:     Head: Normocephalic and atraumatic.     Right Ear: Tympanic membrane normal.     Left Ear: Tympanic membrane normal.  Eyes:     Pupils: Pupils are equal, round, and reactive to light.  Neck:     Thyroid : No thyromegaly.  Cardiovascular:     Rate and Rhythm: Normal rate and regular rhythm.     Heart sounds: Normal heart sounds. No murmur heard. Pulmonary:     Effort: Pulmonary effort is normal. No respiratory distress.     Breath sounds: Normal breath sounds. No wheezing.  Abdominal:     General: Bowel sounds are normal. There is no distension.     Palpations: Abdomen is soft.     Tenderness: There is no abdominal tenderness.  Musculoskeletal:        General: No tenderness. Normal range of motion.     Cervical back: Normal range of motion and neck supple.  Skin:     General: Skin is warm and dry.  Neurological:     Mental Status: She is alert and oriented to person, place, and time.     Cranial Nerves: No cranial nerve deficit.     Deep Tendon Reflexes: Reflexes are normal and symmetric.  Psychiatric:        Behavior: Behavior normal.        Thought Content: Thought content normal.        Judgment: Judgment normal.       BP 119/68   Pulse 63   Temp 97.9 F (36.6 C) (Temporal)   Ht 5' 2 (1.575 m)   Wt 214 lb (97.1 kg)   BMI 39.14 kg/m      Assessment &  Plan:  Dajai Wahlert comes in today with chief complaint of Medical Management of Chronic Issues   Diagnosis and orders addressed:  1. Primary osteoarthritis of right hand - diclofenac  (VOLTAREN ) 75 MG EC tablet; Take 1 tablet (75 mg total) by mouth 2 (two) times daily.  Dispense: 180 tablet; Refill: 2 - CMP14+EGFR - CBC with Differential/Platelet  2. Essential hypertension, benign - hydrochlorothiazide  (HYDRODIURIL ) 25 MG tablet; TAKE ONE (1) TABLET EACH DAY  Dispense: 90 tablet; Refill: 2 - CMP14+EGFR - CBC with Differential/Platelet  3. Dysphagia, unspecified type - omeprazole  (PRILOSEC) 20 MG capsule; Take 1 capsule (20 mg total) by mouth daily.  Dispense: 90 capsule; Refill: 3 - CMP14+EGFR - CBC with Differential/Platelet  4. Hyperlipidemia with target LDL less than 100 - simvastatin  (ZOCOR ) 40 MG tablet; Take 1 tablet (40 mg total) by mouth at bedtime.  Dispense: 90 tablet; Refill: 2 - CMP14+EGFR - CBC with Differential/Platelet - Lipid panel  5. Annual physical exam (Primary) - CMP14+EGFR - CBC with Differential/Platelet - Lipid panel - TSH  6. Hypothyroidism due to acquired atrophy of thyroid  - CMP14+EGFR - CBC with Differential/Platelet - TSH  7. Morbid obesity (HCC) - CMP14+EGFR - CBC with Differential/Platelet  8. Colon cancer screening - Ambulatory referral to Gastroenterology - CMP14+EGFR - CBC with Differential/Platelet   Labs  pending Continue current medications  Keep follow up with specialists  Health Maintenance reviewed Diet and exercise encouraged  Return in about 6 months (around 10/23/2024), or if symptoms worsen or fail to improve.    Bari Learn, FNP

## 2024-04-23 ENCOUNTER — Other Ambulatory Visit: Payer: Self-pay | Admitting: Family

## 2024-04-23 ENCOUNTER — Ambulatory Visit: Payer: Self-pay | Admitting: Family

## 2024-04-23 LAB — LIPID PANEL
Cholesterol, Total: 121 mg/dL (ref 100–199)
HDL: 46 mg/dL (ref >39)
LDL CALC COMMENT:: 2.6 ratio (ref 0.0–4.4)
LDL Chol Calc (NIH): 58 mg/dL (ref 0–99)
Triglycerides: 89 mg/dL (ref 0–149)
VLDL Cholesterol Cal: 17 mg/dL (ref 5–40)

## 2024-04-23 LAB — CMP14+EGFR
ALT: 9 IU/L (ref 0–32)
AST: 16 IU/L (ref 0–40)
Albumin: 4.3 g/dL (ref 3.9–4.9)
Alkaline Phosphatase: 79 IU/L (ref 44–121)
BUN/Creatinine Ratio: 16 (ref 12–28)
BUN: 15 mg/dL (ref 8–27)
Bilirubin Total: 0.8 mg/dL (ref 0.0–1.2)
CO2: 24 mmol/L (ref 20–29)
Calcium: 10.1 mg/dL (ref 8.7–10.3)
Chloride: 104 mmol/L (ref 96–106)
Creatinine, Ser: 0.95 mg/dL (ref 0.57–1.00)
Globulin, Total: 2.2 g/dL (ref 1.5–4.5)
Glucose: 97 mg/dL (ref 70–99)
Potassium: 4.4 mmol/L (ref 3.5–5.2)
Sodium: 142 mmol/L (ref 134–144)
Total Protein: 6.5 g/dL (ref 6.0–8.5)
eGFR: 66 mL/min/1.73 (ref >59)

## 2024-04-23 LAB — CBC WITH DIFFERENTIAL/PLATELET
Basophils Absolute: 0.1 x10E3/uL (ref 0.0–0.2)
Basos: 1 %
EOS (ABSOLUTE): 0.3 x10E3/uL (ref 0.0–0.4)
Eos: 4 %
Hematocrit: 45 % (ref 34.0–46.6)
Hemoglobin: 14.2 g/dL (ref 11.1–15.9)
Immature Grans (Abs): 0.1 x10E3/uL (ref 0.0–0.1)
Immature Granulocytes: 1 %
Lymphocytes Absolute: 2 x10E3/uL (ref 0.7–3.1)
Lymphs: 30 %
MCH: 29.2 pg (ref 26.6–33.0)
MCHC: 31.6 g/dL (ref 31.5–35.7)
MCV: 93 fL (ref 79–97)
Monocytes Absolute: 0.5 x10E3/uL (ref 0.1–0.9)
Monocytes: 7 %
Neutrophils Absolute: 3.9 x10E3/uL (ref 1.4–7.0)
Neutrophils: 57 %
Platelets: 273 x10E3/uL (ref 150–450)
RBC: 4.86 x10E6/uL (ref 3.77–5.28)
RDW: 12.4 % (ref 11.7–15.4)
WBC: 6.8 x10E3/uL (ref 3.4–10.8)

## 2024-04-23 LAB — TSH: TSH: 0.157 u[IU]/mL — AB (ref 0.450–4.500)

## 2024-04-23 MED ORDER — LEVOTHYROXINE SODIUM 88 MCG PO TABS: 88.0000 ug | ORAL_TABLET | Freq: Every day | ORAL | 1 refills | Status: DC

## 2024-06-19 ENCOUNTER — Other Ambulatory Visit: Payer: Self-pay | Admitting: Family

## 2024-06-28 ENCOUNTER — Other Ambulatory Visit: Payer: Self-pay | Admitting: Family Medicine

## 2024-06-28 DIAGNOSIS — E039 Hypothyroidism, unspecified: Secondary | ICD-10-CM

## 2024-07-02 ENCOUNTER — Other Ambulatory Visit

## 2024-07-02 DIAGNOSIS — E039 Hypothyroidism, unspecified: Secondary | ICD-10-CM

## 2024-07-03 LAB — TSH: TSH: 2.37 u[IU]/mL (ref 0.450–4.500)

## 2024-07-04 ENCOUNTER — Ambulatory Visit: Payer: Self-pay | Admitting: Family

## 2024-07-15 ENCOUNTER — Encounter: Payer: Self-pay | Admitting: Internal Medicine

## 2024-07-19 ENCOUNTER — Other Ambulatory Visit: Payer: Self-pay | Admitting: Family Medicine

## 2024-07-19 ENCOUNTER — Other Ambulatory Visit: Payer: Self-pay | Admitting: *Deleted

## 2024-10-24 ENCOUNTER — Ambulatory Visit: Payer: Self-pay | Admitting: Family
